# Patient Record
Sex: Female | Born: 1980 | Race: White | Hispanic: No | Marital: Married | Smoking: Never smoker
Health system: Southern US, Community
[De-identification: ages and names within clinical notes are randomized; demographics above are authoritative.]

## PROBLEM LIST (undated history)

## (undated) ENCOUNTER — Ambulatory Visit (HOSPITAL_BASED_OUTPATIENT_CLINIC_OR_DEPARTMENT_OTHER)

## (undated) DIAGNOSIS — E079 Disorder of thyroid, unspecified: Secondary | ICD-10-CM

## (undated) DIAGNOSIS — J019 Acute sinusitis, unspecified: Secondary | ICD-10-CM

## (undated) DIAGNOSIS — G4733 Obstructive sleep apnea (adult) (pediatric): Secondary | ICD-10-CM

## (undated) DIAGNOSIS — F419 Anxiety disorder, unspecified: Secondary | ICD-10-CM

## (undated) DIAGNOSIS — Z9889 Other specified postprocedural states: Secondary | ICD-10-CM

## (undated) DIAGNOSIS — E039 Hypothyroidism, unspecified: Secondary | ICD-10-CM

## (undated) DIAGNOSIS — Z9989 Dependence on other enabling machines and devices: Secondary | ICD-10-CM

## (undated) DIAGNOSIS — R112 Nausea with vomiting, unspecified: Secondary | ICD-10-CM

## (undated) HISTORY — PX: OTHER SURGICAL HISTORY: SHX169

## (undated) HISTORY — PX: BREAST SURGERY: SHX581

## (undated) HISTORY — DX: Anxiety disorder, unspecified: F41.9

## (undated) HISTORY — DX: Disorder of thyroid, unspecified: E07.9

## (undated) HISTORY — DX: Obstructive sleep apnea (adult) (pediatric): Z99.89

## (undated) HISTORY — DX: Obstructive sleep apnea (adult) (pediatric): G47.33

---

## 2007-01-05 ENCOUNTER — Other Ambulatory Visit: Admission: RE | Admit: 2007-01-05 | Discharge: 2007-01-05 | Payer: Self-pay | Admitting: Family Medicine

## 2008-11-26 ENCOUNTER — Inpatient Hospital Stay (HOSPITAL_COMMUNITY): Admission: AD | Admit: 2008-11-26 | Discharge: 2008-11-28 | Payer: Self-pay | Admitting: Obstetrics & Gynecology

## 2008-11-26 ENCOUNTER — Encounter (INDEPENDENT_AMBULATORY_CARE_PROVIDER_SITE_OTHER): Payer: Self-pay | Admitting: Obstetrics and Gynecology

## 2010-09-21 LAB — CBC
HCT: 29.9 % — ABNORMAL LOW (ref 36.0–46.0)
HCT: 37.7 % (ref 36.0–46.0)
Hemoglobin: 10.9 g/dL — ABNORMAL LOW (ref 12.0–15.0)
MCHC: 36.3 g/dL — ABNORMAL HIGH (ref 30.0–36.0)
MCHC: 36 g/dL (ref 30.0–36.0)
MCV: 91.4 fL (ref 78.0–100.0)
MCV: 90.8 fL (ref 78.0–100.0)
Platelets: 166 10*3/uL (ref 150–400)
Platelets: 174 10*3/uL (ref 150–400)
RBC: 3.27 MIL/uL — ABNORMAL LOW (ref 3.87–5.11)
RBC: 4.23 MIL/uL (ref 3.87–5.11)
RDW: 14.2 % (ref 11.5–15.5)
RDW: 14.5 % (ref 11.5–15.5)

## 2010-09-21 LAB — COMPREHENSIVE METABOLIC PANEL
Albumin: 2.9 g/dL — ABNORMAL LOW (ref 3.5–5.2)
Alkaline Phosphatase: 125 U/L — ABNORMAL HIGH (ref 39–117)
BUN: 6 mg/dL (ref 6–23)
Potassium: 3.7 mEq/L (ref 3.5–5.1)
Total Protein: 5.8 g/dL — ABNORMAL LOW (ref 6.0–8.3)

## 2010-09-21 LAB — URIC ACID: Uric Acid, Serum: 5.6 mg/dL (ref 2.4–7.0)

## 2010-09-21 LAB — RPR: RPR Ser Ql: NONREACTIVE

## 2010-10-27 NOTE — Op Note (Signed)
Kim Lopez, Kim Lopez                ACCOUNT NO.:  [id number]   MEDICAL RECORD NO.:  [id number]          PATIENT TYPE:  INP   LOCATION:  9102                          FACILITY:  [facility]   PHYSICIAN:  Kendra H. Tenny Craw, MD     DATE OF BIRTH:  [birth year]   DATE OF PROCEDURE:  11/26/2008  DATE OF DISCHARGE:                               OPERATIVE REPORT   PREOPERATIVE DIAGNOSES:  1. A 39-week intrauterine pregnancy.  2. Failure to progress.  3. Suspected fetal macrosomia.  4. Maternal obesity.   POSTOPERATIVE DIAGNOSES:  1. A 39-week intrauterine pregnancy.  2. Failure to progress.  3. Maternal obesity.  4. Brow presentation.   PROCEDURE:  Primary low transverse cesarean section via Pfannenstiel  skin incision.   SURGEON:  Freddrick March. Tenny Craw, MD   ASSISTANT:  None.   ANESTHESIA:  Epidural.   OPERATIVE FINDINGS:  Vigorous female infant in the vertex brow  presentation, weighing 6 pounds 10 ounces with Apgar scores of 8 at 1  minute and 9 at 5 minutes.  There was an arcuate appearance to the  patient's uterus.  Ovaries and tubes were normal in appearance as was  the appendix which was visualized during the procedure.   SPECIMENS:  Placenta to Pathology.   ESTIMATED BLOOD LOSS:  700 mL.   COMPLICATIONS:  None.   PROCEDURE:  Kim Lopez is a 30 year old, G1, P0 at 39 weeks and 2 days,  who was admitted for spontaneous rupture of membranes at 5 o'clock on  the morning of November 26, 2008.  At that time, she was 1-2 cm dilated at  the noon.  She was 4-5 cm dilated, 80% effaced, -1 station.  A small  amount of caput and molding were noted.  An estimated fetal weight had  been obtained by ultrasound on 1 week prior to admission which  demonstrated an estimated fetal weight of 3900 g.  Given the estimated  fetal weight and the development of caput and molding, there was concern  for cephalopelvic disproportion.  An intrauterine pressure catheter was  placed at this time as she desired a trial  of labor.  Pitocin was  required for augmentation.  Actually, the patient received her epidural.  However at 5:30, the patient had made no further cervical change and the  caput and molding was more pronounced as were more severe variables with  contractions.  Given the concern for failure of cephalopelvic  disproportion and failure to progress, the decision was made to proceed  with primary low transverse cesarean section.  Risks, benefits, and  alternatives of the procedure were discussed with the patient and her  husband.  Following the appropriate informed consent, the patient was  brought to the operating room where epidural anesthesia was confirmed to  be adequate.  She was placed in the dorsal supine position with a  leftward tilt, prepped and draped in normal sterile fashion.  A scalpel  was used to make a Pfannenstiel skin incision which was carried down  through the underlying layers of soft tissue to the fascia.  Fascia was  incised in the midline.  Fascial incision was extended laterally with  Mayo scissors.  Superior aspect of the fascial incision was grasped with  Kocher clamps, tented up.  The underlying rectus muscles were dissected  off sharply with the electrocautery unit.  The same procedure was  repeated on the inferior aspect of the fascial incision.  The abdominal  peritoneum was then identified, tented up, entered sharply with the  Metzenbaum.  The incision was extended superiorly and inferiorly with  good visualization of the bladder.  At this time, the appendix was  visualized and the incision was found to be normal.  The Alexis  retractor was then placed in the abdominal cavity and erected.  The  small bowel was packed away with moist laparotomy sponge.  The  vesicouterine peritoneum was then tented up, entered sharply with  Metzenbaum, and the incision was extended laterally, and the bladder  flap was created with blunt dissection.  The bandage scissors were  then  used to extend the left aspect of the incision.  The fetal vertex was  identified and delivered through the uterine incision followed by the  body.  The infant cried vigorously on the operative field.  At the time  of delivery, the caput and molding was noted on the anterior portion of  the infant's head consistent with a brow presentation.  Infant was  passed to awaiting pediatricians.  Placenta was then spontaneously  delivered.  The uterus was exteriorized and cleared of all clot and  debris.  The uterus was noted to have an arcuate appearance and palpably  the internal portion of the uterus felt arcuate.  The uterine incision  was repaired with #1 chromic in a running locked fashion followed by a  second imbricating layer.  Ovaries and tubes were found to be normal.  The uterus was returned to the abdominal cavity.  The abdominal cavity  was cleared of all clot and debris.  Uterine incision was found to be  hemostatic.  The Alexis retractor was then disengaged.  The abdominal  peritoneum was reapproximated with 2-0 Vicryl in a running fashion.  The  rectus muscles were reapproximated with #1 chromic in a running fashion.  The fascia was reapproximated with a looped PDS.  The subcutaneous  tissue was reapproximated with 2-0 plain gut suture and the skin was  closed with staples.  All sponge, lap, needle counts were correct x2.  The patient tolerated the procedure well and was brought to the recovery  room in stable condition.      Freddrick March. Tenny Craw, MD  Electronically Signed     KHR/MEDQ  D:  11/26/2008  T:  11/27/2008  Job:  [id number]

## 2010-10-30 NOTE — Discharge Summary (Signed)
Kim Lopez, Kim Lopez NO.:  [id number]   MEDICAL RECORD NO.:  [id number]          PATIENT TYPE:  INP   LOCATION:  9102                          FACILITY:  [facility]   PHYSICIAN:  Carrington Clamp, M.D. DATE OF BIRTH:  20-Dec-1980   DATE OF ADMISSION:  11/26/2008  DATE OF DISCHARGE:  11/28/2008                               DISCHARGE SUMMARY   FINAL DIAGNOSES:  Intrauterine pregnancy at 57 weeks' gestation, failure  to progress, suspected fetal macrosomia, maternal obesity, and brow  presentation.   PROCEDURE:  Primary low-transverse cesarean section.   SURGEON:  Freddrick March. Tenny Craw, MD   COMPLICATIONS:  None.   This 30 year old, G1, P0 was admitted with spontaneous rupture of  membranes about 39-2/[redacted] weeks gestation.  The patient's antepartum course  up to this point had been uncomplicated.  The patient does have a  history of depression anxiety, but had not been on medications.  During  this gestation, besides obesity, she has no other major medical  problems.   HOSPITAL COURSE:  She was admitted.  She dilated about 4-5 cm dilation,  80% effaced at -1 station.  At this point, a small amount of clubfoot  molding were noted.  The patient had had an ultrasound a week earlier  showing estimated fetal weight of about 1300 g.  There was some concern  for cephalo-pelvic disproportion.  IUPCs were placed and Pitocin was  required for augmentation.  The patient did receive an epidural for pain  control of 530.  The patient had not made any further change in her  cervix.  The molding was more pronounced.  At this point, a discussion  was held with the patient, decision was made to proceed with cesarean  section secondary to failure to progress.  The patient was taken to the  operating room on November 26, 2008 by Dr. Waynard Reeds, where a primary low  transverse cesarean section was performed with delivery of a 6 pound 10  ounce female infant with Apgars of 8 and 9.  Delivery went  without  complications.  There was an articulate appearance to the patient's  uterus, but no complications.  The patient's postoperative course was  benign without any significant fevers.  The patient was felt ready for  discharge on postoperative day #2.  She was sent home on a regular diet,  told to decrease activities, told to continue her prenatal vitamins, was  given a prescription for Percocet 1 to 2 every 4-6 hours as needed for  pain.  The patient did not need RhoGAM.  She is Rh negative, but the  baby was Rh negative as well.  She was to follow up in the office the  next day for her staple removal.  Instructions and precautions were  reviewed with the patient.   LABORATORY ON DISCHARGE:  The patient had a hemoglobin of 10.9, white  blood cell count of 15.0, and platelets of 129,000.      Leilani Able, P.A.-C.      Carrington Clamp, M.D.  Electronically Signed    MB/MEDQ  D:  12/09/2008  T:  12/10/2008  Job:  [id number]

## 2012-11-25 ENCOUNTER — Ambulatory Visit (INDEPENDENT_AMBULATORY_CARE_PROVIDER_SITE_OTHER): Payer: Federal, State, Local not specified - PPO | Admitting: Family Medicine

## 2012-11-25 DIAGNOSIS — H6690 Otitis media, unspecified, unspecified ear: Secondary | ICD-10-CM

## 2012-11-25 DIAGNOSIS — H9203 Otalgia, bilateral: Secondary | ICD-10-CM

## 2012-11-25 MED ORDER — HYDROCORTISONE-ACETIC ACID 1-2 % OT SOLN: 5.0000 [drp] | Freq: Two times a day (BID) | OTIC | Status: DC

## 2012-11-25 MED ORDER — AMOXICILLIN 875 MG PO TABS: 875.0000 mg | ORAL_TABLET | Freq: Two times a day (BID) | ORAL | Status: DC

## 2012-11-25 NOTE — Progress Notes (Signed)
[facility]:  Office Visit  Chief Complaint:   Chief Complaint  Patient presents with  . Otalgia  . Ear Drainage    HPI: Kim Lopez is a 32 y.o. female who complains of  Bilateral ear pain x 2-3 weeks, pain started yesterday, sharp in right and milky discharge. She was being seen by ENT and they had givenher drops but she stopped using the drops since they recently moved and the drops are in a box somewhere. She is new to this area. She denies fevers, chills, n/v, hearing loss. No ringing in ear. Just pain and Dc  Past Medical History  Diagnosis Date  . Anxiety   . Thyroid disease    Past Surgical History  Procedure Laterality Date  . Breast surgery    . Cesarean section     History   Social History  . Marital Status: Married    Spouse Name: N/A    Number of Children: N/A  . Years of Education: N/A   Social History Main Topics  . Smoking status: Never Smoker   . Smokeless tobacco: None  . Alcohol Use: None  . Drug Use: None  . Sexually Active: None   Other Topics Concern  . None   Social History Narrative  . None   Family History  Problem Relation Age of Onset  . Heart attack Mother    No Known Allergies Prior to Admission medications   Medication Sig Start Date End Date Taking? Authorizing Provider  levothyroxine (SYNTHROID, LEVOTHROID) 100 MCG tablet Take 100 mcg by mouth daily before breakfast.   Yes Historical Provider, MD     ROS: The patient denies fevers, chills, night sweats, unintentional weight loss, chest pain, palpitations, wheezing, dyspnea on exertion, nausea, vomiting, abdominal pain, dysuria, hematuria, melena, numbness, weakness, or tingling.  All other systems have been reviewed and were otherwise negative with the exception of those mentioned in the HPI and as above.    PHYSICAL EXAM: Filed Vitals:   11/25/12 1322  BP: 117/76  Pulse: 60  Temp: 97.9 F (36.6 C)  Resp: 16   Filed Vitals:   11/25/12 1322   Height: 5\' 1"  (1.549 m)  Weight: 222 lb (100.699 kg)   Body mass index is 41.97 kg/(m^2).  General: Alert, no acute distress HEENT:  Normocephalic, atraumatic, oropharynx patent. Bilateral white dc but does not look like fungal in origin. + Tm erythema bil Cardiovascular:  Regular rate and rhythm, no rubs murmurs or gallops.  No Carotid bruits, radial pulse intact. No pedal edema.  Respiratory: Clear to auscultation bilaterally.  No wheezes, rales, or rhonchi.  No cyanosis, no use of accessory musculature GI: No organomegaly, abdomen is soft and non-tender, positive bowel sounds.  No masses. Skin: No rashes. Neurologic: Facial musculature symmetric. Psychiatric: Patient is appropriate throughout our interaction. Lymphatic: No cervical lymphadenopathy Musculoskeletal: Gait intact.   LABS: Results for orders placed during the hospital encounter of 11/26/08  CBC      Result Value Range   WBC 15.0 (*) 4.0 - 10.5 K/uL   RBC 3.27 (*) 3.87 - 5.11 MIL/uL   Hemoglobin 10.9 DELTA CHECK NOTED (*) 12.0 - 15.0 g/dL   HCT 45.4 (*) 09.8 - 11.9 %   MCV 91.4  78.0 - 100.0 fL   MCHC 36.3 (*) 30.0 - 36.0 g/dL   RDW 14.7  82.9 - 56.2 %   Platelets 129 DELTA CHECK NOTED (*) 150 - 400 K/uL  CBC  Result Value Range   WBC 10.9 (*) 4.0 - 10.5 K/uL   RBC 4.23  3.87 - 5.11 MIL/uL   Hemoglobin 13.8  12.0 - 15.0 g/dL   HCT 16.1  09.6 - 04.5 %   MCV 90.8  78.0 - 100.0 fL   MCHC 36.0  30.0 - 36.0 g/dL   RDW 40.9  81.1 - 91.4 %   Platelets 166  150 - 400 K/uL  RPR      Result Value Range   RPR NON REACTIVE  NON REACTIVE  CBC      Result Value Range   WBC 12.9 (*) 4.0 - 10.5 K/uL   RBC 4.13  3.87 - 5.11 MIL/uL   Hemoglobin 13.4  12.0 - 15.0 g/dL   HCT 78.2  95.6 - 21.3 %   MCV 91.3  78.0 - 100.0 fL   MCHC 35.7 CORRECTED FOR COLD AGGLUTININS  30.0 - 36.0 g/dL   RDW 08.6  57.8 - 46.9 %   Platelets 174  150 - 400 K/uL  COMPREHENSIVE METABOLIC PANEL      Result Value Range   Sodium 134 (*) 135  - 145 mEq/L   Potassium 3.7  3.5 - 5.1 mEq/L   Chloride 104  96 - 112 mEq/L   CO2 22  19 - 32 mEq/L   Glucose, Bld 84  70 - 99 mg/dL   BUN 6  6 - 23 mg/dL   Creatinine, Ser 6.29  0.4 - 1.2 mg/dL   Calcium 9.6  8.4 - 52.8 mg/dL   Total Protein 5.8 (*) 6.0 - 8.3 g/dL   Albumin 2.9 (*) 3.5 - 5.2 g/dL   AST 20  0 - 37 U/L   ALT 16  0 - 35 U/L   Alkaline Phosphatase 125 (*) 39 - 117 U/L   Total Bilirubin 0.7  0.3 - 1.2 mg/dL   GFR calc non Af Amer >60  >60 mL/min   GFR calc Af Amer    >60 mL/min   Value: >60            The eGFR has been calculated     using the MDRD equation.     This calculation has not been     validated in all clinical     situations.     eGFR's persistently     <60 mL/min signify     possible Chronic Kidney Disease.  LACTATE DEHYDROGENASE      Result Value Range   LDH 137  94 - 250 U/L  URIC ACID      Result Value Range   Uric Acid, Serum 5.6  2.4 - 7.0 mg/dL     EKG/XRAY:   Primary read interpreted by Dr. Conley Rolls at [facility].   ASSESSMENT/PLAN: Encounter Diagnoses  Name Primary?  . Otitis media, unspecified laterality Yes  . Otalgia, bilateral    Vosol HCT Amox 875 mg BID Call me with ear medication once she finds the drops.  Refer to ENT F/u prn   Carrye Goller PHUONG, DO 11/27/2012 9:29 AM

## 2012-12-11 DIAGNOSIS — H60393 Other infective otitis externa, bilateral: Secondary | ICD-10-CM

## 2012-12-11 DIAGNOSIS — M26629 Arthralgia of temporomandibular joint, unspecified side: Secondary | ICD-10-CM

## 2012-12-11 DIAGNOSIS — H60399 Other infective otitis externa, unspecified ear: Secondary | ICD-10-CM | POA: Insufficient documentation

## 2013-08-24 ENCOUNTER — Encounter (INDEPENDENT_AMBULATORY_CARE_PROVIDER_SITE_OTHER): Payer: Self-pay | Admitting: General Surgery

## 2013-08-24 ENCOUNTER — Ambulatory Visit (INDEPENDENT_AMBULATORY_CARE_PROVIDER_SITE_OTHER): Payer: Federal, State, Local not specified - PPO | Admitting: General Surgery

## 2013-08-24 ENCOUNTER — Encounter (INDEPENDENT_AMBULATORY_CARE_PROVIDER_SITE_OTHER): Payer: Self-pay

## 2013-08-24 DIAGNOSIS — G4733 Obstructive sleep apnea (adult) (pediatric): Secondary | ICD-10-CM

## 2013-08-24 DIAGNOSIS — E785 Hyperlipidemia, unspecified: Secondary | ICD-10-CM

## 2013-08-24 LAB — CBC WITH DIFFERENTIAL/PLATELET
BASOS ABS: 0 10*3/uL (ref 0.0–0.1)
BASOS PCT: 0 % (ref 0–1)
EOS ABS: 0.1 10*3/uL (ref 0.0–0.7)
EOS PCT: 2 % (ref 0–5)
HCT: 41.2 % (ref 36.0–46.0)
Hemoglobin: 14.6 g/dL (ref 12.0–15.0)
LYMPHS ABS: 2.4 10*3/uL (ref 0.7–4.0)
Lymphocytes Relative: 40 % (ref 12–46)
MCH: 30 pg (ref 26.0–34.0)
MCHC: 35.4 g/dL (ref 30.0–36.0)
MCV: 84.8 fL (ref 78.0–100.0)
Monocytes Absolute: 0.4 10*3/uL (ref 0.1–1.0)
Monocytes Relative: 7 % (ref 3–12)
NEUTROS PCT: 51 % (ref 43–77)
Neutro Abs: 3.1 10*3/uL (ref 1.7–7.7)
PLATELETS: 229 10*3/uL (ref 150–400)
RBC: 4.86 MIL/uL (ref 3.87–5.11)
RDW: 13.7 % (ref 11.5–15.5)
WBC: 6 10*3/uL (ref 4.0–10.5)

## 2013-08-24 LAB — LIPID PANEL
CHOLESTEROL: 219 mg/dL — AB (ref 0–200)
HDL: 42 mg/dL (ref >39)
LDL CALC: 121 mg/dL — AB (ref 0–99)
TRIGLYCERIDES: 279 mg/dL — AB (ref <150)
Total CHOL/HDL Ratio: 5.2 Ratio
VLDL: 56 mg/dL — AB (ref 0–40)

## 2013-08-24 LAB — COMPREHENSIVE METABOLIC PANEL
ALK PHOS: 64 U/L (ref 39–117)
ALT: 33 U/L (ref 0–35)
AST: 21 U/L (ref 0–37)
Albumin: 4.5 g/dL (ref 3.5–5.2)
BILIRUBIN TOTAL: 0.5 mg/dL (ref 0.2–1.2)
BUN: 8 mg/dL (ref 6–23)
CO2: 29 meq/L (ref 19–32)
CREATININE: 0.73 mg/dL (ref 0.50–1.10)
Calcium: 9.6 mg/dL (ref 8.4–10.5)
Chloride: 101 mEq/L (ref 96–112)
Glucose, Bld: 90 mg/dL (ref 70–99)
Potassium: 4.1 mEq/L (ref 3.5–5.3)
SODIUM: 137 meq/L (ref 135–145)
TOTAL PROTEIN: 6.9 g/dL (ref 6.0–8.3)

## 2013-08-24 NOTE — Progress Notes (Signed)
Subjective:   morbid obesity  Patient ID: Kim SinningKelly Kucher, female   DOB: [birth year], 33 y.o.   MRN: [id number]  HPI Ronny BaconKelly Garmon33 y.o.female presents for consideration for surgical treatment for morbid obesity.  She  gives a history of progressive obesity since early 4320s despite multiple attempts at medical management. Her weight increased significantly after the birth of her child 4 years ago. She has been through enumerable efforts at nonsurgical weight loss and is able to lose about 20 pounds but then experiences progressive weight regain plus additional weight.  her weight has been affecting her in a number of ways including a diagnosis of sleep apnea requiring CPAP, some dyspnea on exertion and fatigue and joint pain and difficulty enjoying activities with her family and routine activities and exercise. She is concerned about her long-term health going forward..   She has been to our initial information seminar, researched surgical options thoroughly and is interested in lap band surgery due to the less invasive nature of the surgery.  Past Medical History  Diagnosis Date  . Anxiety   . Thyroid disease   . OSA on CPAP    Past Surgical History  Procedure Laterality Date  . Breast surgery    . Cesarean section     Current Outpatient Prescriptions  Medication Sig Dispense Refill  . levothyroxine (SYNTHROID, LEVOTHROID) 100 MCG tablet Take 100 mcg by mouth daily before breakfast.       No current facility-administered medications for this visit.   No Known Allergies History  Substance Use Topics  . Smoking status: Never Smoker   . Smokeless tobacco: Not on file  . Alcohol Use: Not on file        Review of Systems Respiratory: Some dyspnea with exercise Cardiac: Negative Abdomen: No reflux, abdominal pain or history of jaundice GU: Negative Extremities: Some joint pain Hematologic: No history of blood clots or abnormal bleeding    Objective:   Physical Exam BP 126/86  Pulse  80  Temp(Src) 98.4 F (36.9 C) (Oral)  Resp 14  Ht 5\' 2"  (1.575 m)  Wt 233 lb (105.688 kg)  BMI 42.61 kg/m2  General: Alert, OB Caucasian female, in no distress Skin: Warm and dry without rash or infection. HEENT: No palpable masses or thyromegaly. Sclera nonicteric. Pupils equal round and reactive. Oropharynx clear. Lymph nodes: No cervical, supraclavicular, or inguinal nodes palpable. Lungs: Breath sounds clear and equal without increased work of breathing Cardiovascular: Regular rate and rhythm without murmur. No JVD or edema. Peripheral pulses intact. Abdomen: Nondistended. Soft and nontender. No masses palpable. No organomegaly. No palpable hernias. Extremities: No edema or joint swelling or deformity. No chronic venous stasis changes. Neurologic: Alert and fully oriented. Gait normal.    Assessment:     Patient with progressive morbid obesity unresponsive to multiple efforts at medical management who presents with a BMI of 42.6 and comorbidities of obstructive sleep apnea, dyslipidemia and joint pain. I believe there would be very significant medical benefit from surgical weight loss. After our discussion of surgical options currently available the patient has decided to proceed with LAP-BAND placement due to the reasons above. We have discussed the nature of morbid obesity and the risk of remaining obese. We discussed the indications for the procedure, its nature, and expected recovery. The risks of the procedure were discussed in detail including anesthetic complications, bleeding, infection, visceral injury, dysphagia, and long-term risks of mechanical failure, slippage, erosion, esophageal dilatation and failure to lose weight. Rare risk of death  was discussed. The patient was given a complete consent form and all questions were answered. We will proceed with preoperative workup including routine lab and x-rays, psychologic and nutrition evaluations, and upper GI series.  I will see the  patient back following this evaluation.        Plan:     As above

## 2013-08-25 LAB — TSH: TSH: 3.039 u[IU]/mL (ref 0.350–4.500)

## 2013-08-25 LAB — HCG, SERUM, QUALITATIVE: Preg, Serum: NEGATIVE

## 2013-08-25 LAB — T3 UPTAKE: T3 Uptake: 31.6 % (ref 22.5–37.0)

## 2013-08-28 ENCOUNTER — Ambulatory Visit (HOSPITAL_COMMUNITY)
Admission: RE | Admit: 2013-08-28 | Discharge: 2013-08-28 | Disposition: A | Discharge status: Home / Self Care | Payer: Federal, State, Local not specified - PPO | Source: Ambulatory Visit | Attending: General Surgery | Admitting: General Surgery

## 2013-08-28 ENCOUNTER — Other Ambulatory Visit: Payer: Self-pay

## 2013-08-28 DIAGNOSIS — F411 Generalized anxiety disorder: Secondary | ICD-10-CM | POA: Insufficient documentation

## 2013-08-28 DIAGNOSIS — E079 Disorder of thyroid, unspecified: Secondary | ICD-10-CM | POA: Insufficient documentation

## 2013-08-28 DIAGNOSIS — E785 Hyperlipidemia, unspecified: Secondary | ICD-10-CM

## 2013-08-28 DIAGNOSIS — G4733 Obstructive sleep apnea (adult) (pediatric): Secondary | ICD-10-CM | POA: Insufficient documentation

## 2013-08-28 DIAGNOSIS — Z6841 Body Mass Index (BMI) 40.0 and over, adult: Secondary | ICD-10-CM | POA: Insufficient documentation

## 2013-10-10 ENCOUNTER — Encounter: Payer: Federal, State, Local not specified - PPO | Attending: General Surgery | Admitting: Dietician

## 2013-10-10 ENCOUNTER — Encounter: Payer: Self-pay | Admitting: Dietician

## 2013-10-10 DIAGNOSIS — Z6841 Body Mass Index (BMI) 40.0 and over, adult: Secondary | ICD-10-CM | POA: Insufficient documentation

## 2013-10-10 DIAGNOSIS — Z01818 Encounter for other preprocedural examination: Secondary | ICD-10-CM | POA: Insufficient documentation

## 2013-10-10 DIAGNOSIS — Z713 Dietary counseling and surveillance: Secondary | ICD-10-CM | POA: Insufficient documentation

## 2013-10-10 NOTE — Progress Notes (Signed)
  Pre-Op Assessment Visit:  Pre-Operative LAGB Surgery  Medical Nutrition Therapy:  Appt start time: 0800   End time:  0845.  Patient was seen on 10/10/2013 for Pre-Operative LAGB Nutrition Assessment. [facility] on 10/10/2013.   Preferred Learning Style:   No preference indicated   Learning Readiness:  Ready  Handouts given during visit include:  Pre-Op Goals Bariatric Surgery Protein Shakes  Teaching Method Utilized:  Visual Auditory  Barriers to learning/adherence to lifestyle change: none  Demonstrated degree of understanding via:  Teach Back   Patient to call the [facility] to enroll in Pre-Op and Post-Op Nutrition Education when surgery date is scheduled.

## 2014-04-23 ENCOUNTER — Other Ambulatory Visit (INDEPENDENT_AMBULATORY_CARE_PROVIDER_SITE_OTHER): Payer: Self-pay | Admitting: General Surgery

## 2014-05-06 ENCOUNTER — Ambulatory Visit: Payer: Federal, State, Local not specified - PPO

## 2014-05-13 ENCOUNTER — Encounter: Payer: Federal, State, Local not specified - PPO | Attending: General Surgery

## 2014-05-13 DIAGNOSIS — Z6841 Body Mass Index (BMI) 40.0 and over, adult: Secondary | ICD-10-CM | POA: Insufficient documentation

## 2014-05-13 DIAGNOSIS — Z713 Dietary counseling and surveillance: Secondary | ICD-10-CM | POA: Insufficient documentation

## 2014-05-13 NOTE — Patient Instructions (Signed)
Follow:   Pre-Op Diet per MD 2 weeks prior to surgery  Phase 2- Liquids (clear/full) 2 weeks after surgery  Vitamin/Mineral/Calcium guidelines for purchasing bariatric supplements  Exercise guidelines pre and post-op per MD  Follow-up at [facility] in 2 weeks post-op for diet advancement. Contact Leslie Williams or Liz Schonthal as needed with questions/concerns. 

## 2014-05-13 NOTE — Progress Notes (Signed)
  Pre-Operative Nutrition Class:  Appt start time: 6333   End time:  1830.  Patient was seen on 05/13/14 for Pre-Operative Bariatric Surgery Education at the [facility].   Surgery date: 05/28/14 Surgery type: LAGB Start weight at [facility]: 233 lbs on 10/10/13 Weight today: 234.0 lbs  TANITA  BODY COMP RESULTS  05/13/14   BMI (kg/m^2) 42.8   Fat Mass (lbs) 117.0   Fat Free Mass (lbs) 117.0   Total Body Water (lbs) 85.5   Samples given per MNT protocol. Patient educated on appropriate usage: Celebrate Multivitamin Pineapple-Strawberry Lot # [id number] Exp: 01/16  Bariatric Advantage Calcium Citrate with Vitamin D Chocolate Lot # [id number] Exp: 6/16  Bariatric Advantage Vitamin B12 [place] Lot # [id number] Exp: 12/15  Unjury Protein Powder Vanilla Lot # [id number] Exp: 03/2015  Premier Protein Strawberry Lot # [id number] Exp: 11/10/14  PB2 Chocolate Exp: 04/26/15 No lot # given  The following the learning objectives were met by the patient during this course:  Identify Pre-Op Dietary Goals and will begin 2 weeks pre-operatively  Identify appropriate sources of fluids and proteins   State protein recommendations and appropriate sources pre and post-operatively  Identify Post-Operative Dietary Goals and will follow for 2 weeks post-operatively  Identify appropriate multivitamin and calcium sources  Describe the need for physical activity post-operatively and will follow MD recommendations  State when to call healthcare provider regarding medication questions or post-operative complications  Handouts given during class include:  Pre-Op Bariatric Surgery Diet Handout  Protein Shake Handout  Post-Op Bariatric Surgery Nutrition Handout  BELT Program Information Flyer  Support Group Information Flyer  [facility] Bariatric Supplements Price List  Follow-Up Plan: Patient will follow-up at [facility] 2 weeks post operatively for diet  advancement per MD.

## 2014-05-24 ENCOUNTER — Encounter (HOSPITAL_COMMUNITY): Payer: Self-pay

## 2014-05-24 ENCOUNTER — Encounter (HOSPITAL_COMMUNITY)
Admission: RE | Admit: 2014-05-24 | Discharge: 2014-05-24 | Disposition: A | Discharge status: Home / Self Care | Payer: Federal, State, Local not specified - PPO | Source: Ambulatory Visit | Attending: General Surgery | Admitting: General Surgery

## 2014-05-24 DIAGNOSIS — R05 Cough: Secondary | ICD-10-CM | POA: Insufficient documentation

## 2014-05-24 DIAGNOSIS — R490 Dysphonia: Secondary | ICD-10-CM | POA: Insufficient documentation

## 2014-05-24 DIAGNOSIS — R0981 Nasal congestion: Secondary | ICD-10-CM | POA: Diagnosis not present

## 2014-05-24 DIAGNOSIS — Z01812 Encounter for preprocedural laboratory examination: Secondary | ICD-10-CM | POA: Diagnosis present

## 2014-05-24 DIAGNOSIS — J329 Chronic sinusitis, unspecified: Secondary | ICD-10-CM | POA: Insufficient documentation

## 2014-05-24 HISTORY — DX: Other specified postprocedural states: R11.2

## 2014-05-24 HISTORY — DX: Acute sinusitis, unspecified: J01.90

## 2014-05-24 HISTORY — DX: Hypothyroidism, unspecified: E03.9

## 2014-05-24 HISTORY — DX: Other specified postprocedural states: Z98.890

## 2014-05-24 LAB — COMPREHENSIVE METABOLIC PANEL
ALK PHOS: 69 U/L (ref 39–117)
ALT: 40 U/L — AB (ref 0–35)
AST: 47 U/L — AB (ref 0–37)
Albumin: 4.2 g/dL (ref 3.5–5.2)
Anion gap: 15 (ref 5–15)
BUN: 12 mg/dL (ref 6–23)
CALCIUM: 9.8 mg/dL (ref 8.4–10.5)
CO2: 25 mEq/L (ref 19–32)
Chloride: 99 mEq/L (ref 96–112)
Creatinine, Ser: 0.64 mg/dL (ref 0.50–1.10)
GFR calc Af Amer: >90 mL/min (ref >90)
GFR calc non Af Amer: >90 mL/min (ref >90)
Glucose, Bld: 96 mg/dL (ref 70–99)
POTASSIUM: 4.8 meq/L (ref 3.7–5.3)
Sodium: 139 mEq/L (ref 137–147)
TOTAL PROTEIN: 7.8 g/dL (ref 6.0–8.3)
Total Bilirubin: 0.5 mg/dL (ref 0.3–1.2)

## 2014-05-24 LAB — CBC WITH DIFFERENTIAL/PLATELET
BASOS PCT: 1 % (ref 0–1)
Basophils Absolute: 0.1 10*3/uL (ref 0.0–0.1)
EOS ABS: 0.2 10*3/uL (ref 0.0–0.7)
EOS PCT: 3 % (ref 0–5)
HCT: 42 % (ref 36.0–46.0)
HEMOGLOBIN: 14.8 g/dL (ref 12.0–15.0)
Lymphocytes Relative: 37 % (ref 12–46)
Lymphs Abs: 2.2 10*3/uL (ref 0.7–4.0)
MCH: 29.1 pg (ref 26.0–34.0)
MCHC: 35.2 g/dL (ref 30.0–36.0)
MCV: 82.5 fL (ref 78.0–100.0)
MONO ABS: 0.5 10*3/uL (ref 0.1–1.0)
Monocytes Relative: 9 % (ref 3–12)
NEUTROS PCT: 50 % (ref 43–77)
Neutro Abs: 2.9 10*3/uL (ref 1.7–7.7)
PLATELETS: 261 10*3/uL (ref 150–400)
RBC: 5.09 MIL/uL (ref 3.87–5.11)
RDW: 13.1 % (ref 11.5–15.5)
WBC: 5.9 10*3/uL (ref 4.0–10.5)

## 2014-05-24 NOTE — Patient Instructions (Addendum)
Joylene GrapesKelly R Madan  05/24/2014   BRING YOUR CPAP MASK AND TUBING   Your procedure is scheduled on:   Tuesday  December 15th  [facility]                Short Stay Center at  9:25 AM.  Call this number if you have problems the morning of surgery [phone]   Remember:  Do not eat food or drink liquids :After Midnight.     Take these medicines the morning of surgery with A SIP OF WATER:   SYNTHROID                               You may not have any metal on your body including hair pins and              piercings  Do not wear jewelry, make-up, lotions, powders or perfumes.             Do not wear nail polish.  Do not shave  48 hours prior to surgery.              Men may shave face and neck.   Do not bring valuables to the hospital. [facility] IS NOT             RESPONSIBLE   FOR VALUABLES.  Contacts, dentures or bridgework may not be worn into surgery.  Leave suitcase in the car. After surgery it may be brought to your room.     Patients discharged the day of surgery will not be allowed to drive home.  Name and phone number of your driver:  PT'S HUSBAND BRIAN  [phone]  Special Instructions: N/A              Please read over the following fact sheets you were given: _____________________________________________________________________             [facility] Pre[facility] Before surgery, you can play an important role.  Because skin is not sterile, your skin needs to be as free of germs as possible.  You can reduce the number of germs on your skin by washing with CHG (chlorahexidine gluconate) soap before surgery.  CHG is an antiseptic cleaner which kills germs and bonds with the skin to continue killing germs even after washing. Please DO NOT use if you have an allergy to CHG or antibacterial soaps.  If your skin becomes reddened/irritated stop using the CHG and inform your nurse when you arrive at Short Stay. Do not shave (including  legs and underarms) for at least 48 hours prior to the first CHG shower.  You may shave your face/neck. Please follow these instructions carefully:  1.  Shower with CHG Soap the night before surgery and the  morning of Surgery.  2.  If you choose to wash your hair, wash your hair first as usual with your  normal  shampoo.  3.  After you shampoo, rinse your hair and body thoroughly to remove the  shampoo.                           4.  Use CHG as you would any other liquid soap.  You can apply chg directly  to the skin and wash  Gently with a scrungie or clean washcloth.  5.  Apply the CHG Soap to your body ONLY FROM THE NECK DOWN.   Do not use on face/ open                           Wound or open sores. Avoid contact with eyes, ears mouth and genitals (private parts).                       Wash face,  Genitals (private parts) with your normal soap.             6.  Wash thoroughly, paying special attention to the area where your surgery  will be performed.  7.  Thoroughly rinse your body with warm water from the neck down.  8.  DO NOT shower/wash with your normal soap after using and rinsing off  the CHG Soap.                9.  Pat yourself dry with a clean towel.            10.  Wear clean pajamas.            11.  Place clean sheets on your bed the night of your first shower and do not  sleep with pets. Day of Surgery : Do not apply any lotions/deodorants the morning of surgery.  Please wear clean clothes to the hospital/surgery center.  FAILURE TO FOLLOW THESE INSTRUCTIONS MAY RESULT IN THE CANCELLATION OF YOUR SURGERY PATIENT SIGNATURE_________________________________  NURSE SIGNATURE__________________________________  ________________________________________________________________________

## 2014-05-24 NOTE — Pre-Procedure Instructions (Signed)
PT HAS EKG AND CXR REPORT IN EPIC FROM 08-28-13.

## 2014-05-24 NOTE — Progress Notes (Signed)
Dr. Johna SheriffHoxworth - Magnus SinningKelly Lopez [facility]op [facility] - her surgery is 12/15.  I just wanted to make you aware that she started an antibiotic for sinusitis on 12/9  Zithromycin.  She  Does not have fever, does have slight cough from the sinus drainage - but no chest congestion,  Does have head congestion and hoarseness.  Pt knows to notify your office if she gets worse and feels that she is unable to have her surgery.

## 2014-05-28 ENCOUNTER — Encounter (HOSPITAL_COMMUNITY)
Admission: RE | Disposition: A | Discharge status: Home / Self Care | Payer: Self-pay | Source: Ambulatory Visit | Attending: General Surgery

## 2014-05-28 ENCOUNTER — Observation Stay (HOSPITAL_COMMUNITY)
Admission: RE | Admit: 2014-05-28 | Discharge: 2014-05-29 | Disposition: A | Discharge status: Home / Self Care | Payer: Federal, State, Local not specified - PPO | Source: Ambulatory Visit | Attending: General Surgery | Admitting: General Surgery

## 2014-05-28 ENCOUNTER — Ambulatory Visit (HOSPITAL_COMMUNITY): Payer: Federal, State, Local not specified - PPO

## 2014-05-28 ENCOUNTER — Ambulatory Visit (HOSPITAL_COMMUNITY): Payer: Federal, State, Local not specified - PPO | Admitting: Anesthesiology

## 2014-05-28 ENCOUNTER — Encounter (HOSPITAL_COMMUNITY): Payer: Self-pay | Admitting: *Deleted

## 2014-05-28 DIAGNOSIS — G4730 Sleep apnea, unspecified: Secondary | ICD-10-CM | POA: Diagnosis not present

## 2014-05-28 DIAGNOSIS — Z9884 Bariatric surgery status: Secondary | ICD-10-CM

## 2014-05-28 DIAGNOSIS — F329 Major depressive disorder, single episode, unspecified: Secondary | ICD-10-CM | POA: Diagnosis not present

## 2014-05-28 DIAGNOSIS — E780 Pure hypercholesterolemia: Secondary | ICD-10-CM | POA: Diagnosis not present

## 2014-05-28 DIAGNOSIS — E039 Hypothyroidism, unspecified: Secondary | ICD-10-CM | POA: Insufficient documentation

## 2014-05-28 DIAGNOSIS — Z6841 Body Mass Index (BMI) 40.0 and over, adult: Secondary | ICD-10-CM | POA: Diagnosis not present

## 2014-05-28 DIAGNOSIS — K219 Gastro-esophageal reflux disease without esophagitis: Secondary | ICD-10-CM | POA: Insufficient documentation

## 2014-05-28 DIAGNOSIS — F419 Anxiety disorder, unspecified: Secondary | ICD-10-CM | POA: Diagnosis not present

## 2014-05-28 HISTORY — PX: LAPAROSCOPIC GASTRIC BANDING: SHX1100

## 2014-05-28 LAB — PREGNANCY, URINE: Preg Test, Ur: NEGATIVE

## 2014-05-28 SURGERY — GASTRIC BANDING, LAPAROSCOPIC
Anesthesia: General | Site: Abdomen

## 2014-05-28 MED ORDER — PROMETHAZINE HCL 25 MG/ML IJ SOLN
6.2500 mg | INTRAMUSCULAR | Status: DC | PRN
  Administered 2014-05-28: 6.25 mg via INTRAVENOUS
  Filled 2014-05-28: qty 1

## 2014-05-28 MED ORDER — CHLORHEXIDINE GLUCONATE CLOTH 2 % EX PADS: 6.0000 | MEDICATED_PAD | Freq: Once | CUTANEOUS | Status: DC

## 2014-05-28 MED ORDER — 0.9 % SODIUM CHLORIDE (POUR BTL) OPTIME
TOPICAL | Status: DC | PRN
  Administered 2014-05-28: 1000 mL

## 2014-05-28 MED ORDER — ACETAMINOPHEN 160 MG/5ML PO SOLN
325.0000 mg | ORAL | Status: DC | PRN
  Filled 2014-05-28: qty 20.3

## 2014-05-28 MED ORDER — ONDANSETRON HCL 4 MG/2ML IJ SOLN
INTRAMUSCULAR | Status: DC | PRN
Start: 2014-05-28 — End: 2014-05-28
  Administered 2014-05-28: 4 mg via INTRAVENOUS

## 2014-05-28 MED ORDER — NEOSTIGMINE METHYLSULFATE 10 MG/10ML IV SOLN
INTRAVENOUS | Status: DC | PRN
  Administered 2014-05-28: 4 mg via INTRAVENOUS

## 2014-05-28 MED ORDER — UNJURY CHICKEN SOUP POWDER
2.0000 [oz_av] | Freq: Four times a day (QID) | ORAL | Status: DC
  Administered 2014-05-29: 2 [oz_av] via ORAL
  Filled 2014-05-28 (×4): qty 27

## 2014-05-28 MED ORDER — HEPARIN SODIUM (PORCINE) 5000 UNIT/ML IJ SOLN
5000.0000 [IU] | Freq: Three times a day (TID) | INTRAMUSCULAR | Status: DC
  Administered 2014-05-28 – 2014-05-29 (×2): 5000 [IU] via SUBCUTANEOUS
  Filled 2014-05-28 (×6): qty 1

## 2014-05-28 MED ORDER — HYDROMORPHONE HCL 1 MG/ML IJ SOLN
INTRAMUSCULAR | Status: AC
  Filled 2014-05-28: qty 1

## 2014-05-28 MED ORDER — LACTATED RINGERS IV SOLN
INTRAVENOUS | Status: DC | PRN
  Administered 2014-05-28 (×2): via INTRAVENOUS

## 2014-05-28 MED ORDER — SCOPOLAMINE 1 MG/3DAYS TD PT72
MEDICATED_PATCH | TRANSDERMAL | Status: AC
  Filled 2014-05-28: qty 1

## 2014-05-28 MED ORDER — BUPIVACAINE-EPINEPHRINE 0.25% -1:200000 IJ SOLN
INTRAMUSCULAR | Status: DC | PRN
  Administered 2014-05-28: 50 mL

## 2014-05-28 MED ORDER — MEPERIDINE HCL 50 MG/ML IJ SOLN: 6.2500 mg | INTRAMUSCULAR | Status: DC | PRN

## 2014-05-28 MED ORDER — LIDOCAINE HCL (CARDIAC) 20 MG/ML IV SOLN
INTRAVENOUS | Status: DC | PRN
  Administered 2014-05-28: 100 mg via INTRAVENOUS

## 2014-05-28 MED ORDER — GLYCOPYRROLATE 0.2 MG/ML IJ SOLN
INTRAMUSCULAR | Status: AC
  Filled 2014-05-28: qty 3

## 2014-05-28 MED ORDER — CEFOXITIN SODIUM 2 G IV SOLR
INTRAVENOUS | Status: AC
  Filled 2014-05-28: qty 2

## 2014-05-28 MED ORDER — SODIUM CHLORIDE 0.9 % IJ SOLN
INTRAMUSCULAR | Status: AC
  Filled 2014-05-28: qty 30

## 2014-05-28 MED ORDER — HEPARIN SODIUM (PORCINE) 5000 UNIT/ML IJ SOLN
5000.0000 [IU] | INTRAMUSCULAR | Status: AC
  Administered 2014-05-28: 5000 [IU] via SUBCUTANEOUS
  Filled 2014-05-28: qty 1

## 2014-05-28 MED ORDER — NEOSTIGMINE METHYLSULFATE 10 MG/10ML IV SOLN
INTRAVENOUS | Status: AC
  Filled 2014-05-28: qty 1

## 2014-05-28 MED ORDER — SODIUM CHLORIDE 0.9 % IJ SOLN
INTRAMUSCULAR | Status: DC | PRN
  Administered 2014-05-28: 20 mL via INTRAVENOUS

## 2014-05-28 MED ORDER — OXYCODONE HCL 5 MG/5ML PO SOLN: 5.0000 mg | ORAL | Status: AC | PRN

## 2014-05-28 MED ORDER — PROPOFOL 10 MG/ML IV BOLUS
INTRAVENOUS | Status: DC | PRN
  Administered 2014-05-28: 150 mg via INTRAVENOUS

## 2014-05-28 MED ORDER — ACETAMINOPHEN 160 MG/5ML PO SOLN
650.0000 mg | ORAL | Status: DC | PRN
  Filled 2014-05-28: qty 20.3

## 2014-05-28 MED ORDER — ONDANSETRON HCL 4 MG/2ML IJ SOLN
INTRAMUSCULAR | Status: AC
  Filled 2014-05-28: qty 4

## 2014-05-28 MED ORDER — PROPOFOL 10 MG/ML IV BOLUS
INTRAVENOUS | Status: AC
  Filled 2014-05-28: qty 20

## 2014-05-28 MED ORDER — HYDROMORPHONE HCL 1 MG/ML IJ SOLN
0.2500 mg | INTRAMUSCULAR | Status: DC | PRN
  Administered 2014-05-28 (×3): 0.5 mg via INTRAVENOUS

## 2014-05-28 MED ORDER — FENTANYL CITRATE 0.05 MG/ML IJ SOLN
INTRAMUSCULAR | Status: DC | PRN
  Administered 2014-05-28 (×4): 50 ug via INTRAVENOUS

## 2014-05-28 MED ORDER — LIDOCAINE HCL (CARDIAC) 20 MG/ML IV SOLN
INTRAVENOUS | Status: AC
  Filled 2014-05-28: qty 5

## 2014-05-28 MED ORDER — OXYCODONE HCL 5 MG PO TABS: 5.0000 mg | ORAL_TABLET | Freq: Once | ORAL | Status: DC | PRN

## 2014-05-28 MED ORDER — DEXTROSE 5 % IV SOLN
2.0000 g | INTRAVENOUS | Status: AC
  Administered 2014-05-28: 2 g via INTRAVENOUS

## 2014-05-28 MED ORDER — OXYCODONE HCL 5 MG/5ML PO SOLN
5.0000 mg | Freq: Once | ORAL | Status: DC | PRN
  Filled 2014-05-28: qty 5

## 2014-05-28 MED ORDER — GLYCOPYRROLATE 0.2 MG/ML IJ SOLN
INTRAMUSCULAR | Status: DC | PRN
  Administered 2014-05-28: .6 mg via INTRAVENOUS

## 2014-05-28 MED ORDER — OXYCODONE HCL 5 MG/5ML PO SOLN
5.0000 mg | ORAL | Status: DC | PRN
  Administered 2014-05-28: 5 mg via ORAL
  Filled 2014-05-28: qty 5

## 2014-05-28 MED ORDER — MIDAZOLAM HCL 2 MG/2ML IJ SOLN
INTRAMUSCULAR | Status: AC
  Filled 2014-05-28: qty 2

## 2014-05-28 MED ORDER — FENTANYL CITRATE 0.05 MG/ML IJ SOLN
INTRAMUSCULAR | Status: AC
  Filled 2014-05-28: qty 5

## 2014-05-28 MED ORDER — KCL IN DEXTROSE-NACL 20-5-0.9 MEQ/L-%-% IV SOLN
INTRAVENOUS | Status: DC
  Administered 2014-05-28: 20:00:00 via INTRAVENOUS
  Filled 2014-05-28 (×4): qty 1000

## 2014-05-28 MED ORDER — BUPIVACAINE-EPINEPHRINE 0.25% -1:200000 IJ SOLN
INTRAMUSCULAR | Status: AC
  Filled 2014-05-28: qty 1

## 2014-05-28 MED ORDER — DEXAMETHASONE SODIUM PHOSPHATE 10 MG/ML IJ SOLN
INTRAMUSCULAR | Status: DC | PRN
  Administered 2014-05-28: 10 mg via INTRAVENOUS

## 2014-05-28 MED ORDER — UNJURY CHOCOLATE CLASSIC POWDER
2.0000 [oz_av] | Freq: Four times a day (QID) | ORAL | Status: DC
  Filled 2014-05-28 (×4): qty 27

## 2014-05-28 MED ORDER — MIDAZOLAM HCL 5 MG/5ML IJ SOLN
INTRAMUSCULAR | Status: DC | PRN
  Administered 2014-05-28: 2 mg via INTRAVENOUS

## 2014-05-28 MED ORDER — ROCURONIUM BROMIDE 100 MG/10ML IV SOLN
INTRAVENOUS | Status: DC | PRN
  Administered 2014-05-28: 40 mg via INTRAVENOUS

## 2014-05-28 MED ORDER — MORPHINE SULFATE 10 MG/ML IJ SOLN: 2.0000 mg | INTRAMUSCULAR | Status: DC | PRN

## 2014-05-28 MED ORDER — SCOPOLAMINE 1 MG/3DAYS TD PT72
1.0000 | MEDICATED_PATCH | Freq: Once | TRANSDERMAL | Status: DC
  Administered 2014-05-28: 1.5 mg via TRANSDERMAL
  Filled 2014-05-28: qty 1

## 2014-05-28 MED ORDER — ONDANSETRON HCL 4 MG/2ML IJ SOLN: 4.0000 mg | INTRAMUSCULAR | Status: DC | PRN

## 2014-05-28 MED ORDER — UNJURY VANILLA POWDER
2.0000 [oz_av] | Freq: Four times a day (QID) | ORAL | Status: DC
  Filled 2014-05-28 (×4): qty 27

## 2014-05-28 MED ORDER — ROCURONIUM BROMIDE 100 MG/10ML IV SOLN
INTRAVENOUS | Status: AC
  Filled 2014-05-28: qty 1

## 2014-05-28 MED ORDER — LACTATED RINGERS IV SOLN
INTRAVENOUS | Status: DC
  Administered 2014-05-28: 1000 mL via INTRAVENOUS

## 2014-05-28 SURGICAL SUPPLY — 53 items
BAND LAP 10.0 W/TUBES (Band) ×2 IMPLANT
BLADE HEX COATED 2.75 (ELECTRODE) ×2 IMPLANT
BLADE SURG 15 STRL LF DISP TIS (BLADE) ×1 IMPLANT
BLADE SURG 15 STRL SS (BLADE) ×1
BLADE SURG SZ11 CARB STEEL (BLADE) ×2 IMPLANT
CABLE HIGH FREQUENCY MONO STRZ (ELECTRODE) IMPLANT
CANISTER SUCT 3000ML (MISCELLANEOUS) IMPLANT
DECANTER SPIKE VIAL GLASS SM (MISCELLANEOUS) ×2 IMPLANT
DEVICE SUT QUICK LOAD TK 5 (STAPLE) ×6 IMPLANT
DEVICE SUT TI-KNOT TK 5X26 (MISCELLANEOUS) ×2 IMPLANT
DEVICE SUTURE ENDOST 10MM (ENDOMECHANICALS) IMPLANT
DRAPE CAMERA CLOSED 9X96 (DRAPES) ×2 IMPLANT
DRAPE UTILITY XL STRL (DRAPES) ×4 IMPLANT
ELECT REM PT RETURN 9FT ADLT (ELECTROSURGICAL) ×2
ELECTRODE REM PT RTRN 9FT ADLT (ELECTROSURGICAL) ×1 IMPLANT
GLOVE SS BIOGEL STRL SZ 7.5 (GLOVE) ×1 IMPLANT
GLOVE SUPERSENSE BIOGEL SZ 7.5 (GLOVE) ×1
GOWN STRL REUS W/TWL XL LVL3 (GOWN DISPOSABLE) ×8 IMPLANT
HOVERMATT SINGLE USE (MISCELLANEOUS) ×2 IMPLANT
KIT BASIN OR (CUSTOM PROCEDURE TRAY) ×2 IMPLANT
LIQUID BAND (GAUZE/BANDAGES/DRESSINGS) ×2 IMPLANT
MANIFOLD NEPTUNE II (INSTRUMENTS) ×2 IMPLANT
MESH HERNIA 1X4 RECT BARD (Mesh General) ×1 IMPLANT
MESH HERNIA BARD 1X4 (Mesh General) ×1 IMPLANT
NEEDLE SPNL 22GX3.5 QUINCKE BK (NEEDLE) ×2 IMPLANT
NS IRRIG 1000ML POUR BTL (IV SOLUTION) ×2 IMPLANT
PACK UNIVERSAL I (CUSTOM PROCEDURE TRAY) ×2 IMPLANT
PENCIL BUTTON HOLSTER BLD 10FT (ELECTRODE) ×2 IMPLANT
SET IRRIG TUBING LAPAROSCOPIC (IRRIGATION / IRRIGATOR) IMPLANT
SHEARS HARMONIC ACE PLUS 36CM (ENDOMECHANICALS) IMPLANT
SLEEVE ADV FIXATION 5X100MM (TROCAR) ×6 IMPLANT
SLEEVE XCEL OPT CAN 5 100 (ENDOMECHANICALS) IMPLANT
SOLUTION ANTI FOG 6CC (MISCELLANEOUS) ×2 IMPLANT
SPONGE LAP 18X18 X RAY DECT (DISPOSABLE) ×2 IMPLANT
STAPLER VISISTAT 35W (STAPLE) ×2 IMPLANT
SUT DEVICE BRAIDED 2.0X39 (SUTURE) ×6 IMPLANT
SUT ETHIBOND 2 0 SH (SUTURE) ×3
SUT ETHIBOND 2 0 SH 36X2 (SUTURE) ×3 IMPLANT
SUT MNCRL AB 4-0 PS2 18 (SUTURE) ×4 IMPLANT
SUT PROLENE 2 0 CT2 30 (SUTURE) ×2 IMPLANT
SUT SILK 0 (SUTURE) ×1
SUT SILK 0 30XBRD TIE 6 (SUTURE) ×1 IMPLANT
SUT SURGIDAC NAB ES-9 0 48 120 (SUTURE) IMPLANT
SUT VIC AB 2-0 SH 27 (SUTURE) ×1
SUT VIC AB 2-0 SH 27X BRD (SUTURE) ×1 IMPLANT
SYR 20CC LL (SYRINGE) ×4 IMPLANT
TOWEL OR 17X26 10 PK STRL BLUE (TOWEL DISPOSABLE) ×2 IMPLANT
TOWEL OR NON WOVEN STRL DISP B (DISPOSABLE) ×2 IMPLANT
TROCAR ADV FIXATION 5X100MM (TROCAR) ×2 IMPLANT
TROCAR BLADELESS 15MM (ENDOMECHANICALS) ×2 IMPLANT
TROCAR BLADELESS OPT 5 100 (ENDOMECHANICALS) ×2 IMPLANT
TUBE CALIBRATION LAPBAND (TUBING) ×2 IMPLANT
TUBING INSUFFLATION 10FT LAP (TUBING) ×2 IMPLANT

## 2014-05-28 NOTE — Transfer of Care (Signed)
Immediate Anesthesia Transfer of Care Note  Patient: Kim Lopez  Procedure(s) Performed: Procedure(s): LAPAROSCOPIC GASTRIC BANDING (N/A)  Patient Location: PACU  Anesthesia Type:General  Level of Consciousness: awake, oriented and sedated  Airway & Oxygen Therapy: Patient Spontanous Breathing and Patient connected to face mask oxygen  Post-op Assessment: Report given to PACU RN and Post -op Vital signs reviewed and stable  Post vital signs: Reviewed and stable  Complications: No apparent anesthesia complications

## 2014-05-28 NOTE — Anesthesia Preprocedure Evaluation (Signed)
Anesthesia Evaluation  Patient identified by MRN, date of birth, ID band Patient awake    Reviewed: Allergy & Precautions, H&P , NPO status , Patient's Chart, lab work & pertinent test results  History of Anesthesia Complications (+) PONV and history of anesthetic complications  Airway Mallampati: II  TM Distance: >3 FB Neck ROM: Full    Dental no notable dental hx.    Pulmonary sleep apnea ,  breath sounds clear to auscultation  Pulmonary exam normal       Cardiovascular negative cardio ROS  Rhythm:Regular Rate:Normal     Neuro/Psych Anxiety negative neurological ROS  negative psych ROS   GI/Hepatic negative GI ROS, Neg liver ROS,   Endo/Other  Hypothyroidism Morbid obesity  Renal/GU negative Renal ROS     Musculoskeletal negative musculoskeletal ROS (+)   Abdominal (+) + obese,   Peds  Hematology negative hematology ROS (+)   Anesthesia Other Findings   Reproductive/Obstetrics                             Anesthesia Physical Anesthesia Plan  ASA: III  Anesthesia Plan: General   Post-op Pain Management:    Induction: Intravenous  Airway Management Planned: Oral ETT  Additional Equipment: None  Intra-op Plan:   Post-operative Plan: Extubation in OR  Informed Consent: I have reviewed the patients History and Physical, chart, labs and discussed the procedure including the risks, benefits and alternatives for the proposed anesthesia with the patient or authorized representative who has indicated his/her understanding and acceptance.   Dental advisory given  Plan Discussed with: CRNA  Anesthesia Plan Comments:         Anesthesia Quick Evaluation

## 2014-05-28 NOTE — Op Note (Signed)
Preop diagnosis: Morbid obesity   Postop diagnosis: Same  Surgical procedure: Placement of laparoscopic adjustable gastric band  Surgeon:Eleisha Branscomb M.D.  Assistant.: Ovidio Kinavid Newman  Anesthesia: General  Complications: None  EBL: Minimal  Description of procedure: Patient is brought to the operating room, placed in the supine position on the operating table and general anesthesia was induced. Patient did receive preoperative IV antibiotics and subcutaneous heparin and PAS were in place. The abdomen was widely sterilely prepped and draped and correct patient and procedure verified with the patient time out. Trocar sites were infiltrated with local anesthesia. Access was obtained with a 5 mm Optiview trocar in the left upper quadrant without difficulty and pneumoperitoneum was established. There was no evidence of trocar injury. Under direct vision a 5 mm trocar was placed laterally in the right upper quadrant, a 15 mm trocar in the right upper quadrant midclavicular line, and an 5 mm trocar just above and to the left of the umbilicus for the camera port. The patient was placed in steep reverse Trendelenburg position and and through a 5 mm subxiphoid site the [facility] retractor was placed in the left lobe of the liver elevated with actual exposure of the hiatus and upper stomach. Initially the peritoneum over the left crus was incised and careful blunt dissection carried back down along the left crus toward the retrogastric area. The pars flaccida was opened in an avascular area in the base of the right crus at the area of crossing fat was exposed. The peritoneum here was incised and the finger dissector was passed into this space and advanced retrogastric without difficulty and deployed up through the previously dissected area at the angle of Hiss. A flushed AP Standard lap band system was introduced into the abdomen. The tubing was placed in the finger dissector and brought retrogastric and the  band brought back through the retrogastric tunnel without difficulty. With the sizing tube placed orally into the stomach the band was locked into position without any undue tension and the sizing tube was removed. Holding the tubing toward the patient's feet exposing the small gastric pouch the fundus was sutured up over the band to the pouch with 3 interrupted 2-0 Ethibond sutures. The band appeared to be in excellent position. There was no evidence of bleeding or trocar injury or other problems. The Nathanson retractor was removed under direct vision. All CO2 was evacuated and trochars removed. The tubing which had been brought through the right mid abdominal trocar site was cut and attached to the port to which a piece of Prolene mesh had been sutured to the back. This incision was lengthened slightly in a subcutaneous pocket created. The port was positioned subcutaneously and the tubing introduced bluntly into the abdomen. This incision was closed with subcutaneous running 2-0 Vicryl and all skin incisions were closed with subcutaneous 4-0 Monocryl and Dermabond. Sponge needle and instrument counts were correct. The patient was taken to the PACU in good condition.  Mariella SaaBenjamin T Markia Kyer MD, FACS  04/27/2011, 9:11 AM

## 2014-05-28 NOTE — Interval H&P Note (Signed)
History and Physical Interval Note:  05/28/2014 10:50 AM  Kim GrapesKelly R Veley  has presented today for surgery, with the diagnosis of Morbid Obesity  The various methods of treatment have been discussed with the patient and family. After consideration of risks, benefits and other options for treatment, the patient has consented to  Procedure(s): LAPAROSCOPIC GASTRIC BANDING (N/A) as a surgical intervention .  The patient's history has been reviewed, patient examined, no change in status, stable for surgery.  I have reviewed the patient's chart and labs.  Questions were answered to the patient's satisfaction.     Kam Rahimi T

## 2014-05-28 NOTE — Progress Notes (Signed)
Report Called to K. Barcey RN.

## 2014-05-28 NOTE — Progress Notes (Signed)
Skip EstimableLaurie Deaton, RN, in talking with patient, pt. C/o nausea , medication given for nausea, Skip EstimableLaurie Deaton Rn updated Dr. Johna SheriffHoxworth on pt's status, Orders received for pt.  stay overnight.

## 2014-05-28 NOTE — Anesthesia Postprocedure Evaluation (Signed)
  Anesthesia Post-op Note  Patient: Kim GrapesKelly R Ganesh  Procedure(s) Performed: Procedure(s) (LRB): LAPAROSCOPIC GASTRIC BANDING (N/A)  Patient Location: PACU  Anesthesia Type: General  Level of Consciousness: awake and alert   Airway and Oxygen Therapy: Patient Spontanous Breathing  Post-op Pain: mild  Post-op Assessment: Post-op Vital signs reviewed, Patient's Cardiovascular Status Stable, Respiratory Function Stable, Patent Airway and No signs of Nausea or vomiting  Last Vitals:  Filed Vitals:   05/28/14 1445  BP:   Pulse: 88  Temp:   Resp: 12    Post-op Vital Signs: stable   Complications: No apparent anesthesia complications

## 2014-05-28 NOTE — H&P (Signed)
History of Present Illness Kim Lopez(Kim Lopez; 05/17/2014 11:57 AM) Patient words: baritric f/u.  The patient is a 33 year old female who presents with obesity. Patient returns for her preop visit prior to planned lap band placement. She generally has been feeling well without intercurrent illness. We reviewed her preoperative workup. No concerns on psychologic or nutrition evaluation. Upper GI series was normal with no evidence of hiatal hernia or reflux. Lab work was unremarkable except for elevated cholesterol and lipids.   Other Problems Kim Lopez(Kim Lopez, Kim Lopez; 05/17/2014 11:39 AM) Anxiety Disorder Back Pain Depression Gastroesophageal Reflux Disease Hypercholesterolemia Sleep Apnea Thyroid Disease  Diagnostic Studies History Kim Lopez(Kim Lopez, Kim Lopez; 05/17/2014 11:39 AM) Colonoscopy never Mammogram never Pap Smear 1-5 years ago  Allergies Kim Lopez(Kim Lopez, Kim Lopez; 05/17/2014 11:40 AM) No Known Drug Allergies12/09/2013  Medication History (Kim Lopez, Kim Lopez; 05/17/2014 11:40 AM) Levothyroxine Sodium (125MCG Tablet, Oral) Active.  Social History Kim Lopez(Kim Lopez, Kim Lopez; 05/17/2014 11:39 AM) Alcohol use Moderate alcohol use. Caffeine use Carbonated beverages. No drug use Tobacco use Never smoker.  Family History Kim Lopez(Kim Lopez, Kim Lopez; 05/17/2014 11:39 AM) Heart Disease Mother. Thyroid problems Mother, Sister.  Pregnancy / Birth History Kim Lopez(Kim Lopez, Kim Lopez; 05/17/2014 11:39 AM) Age at menarche 11 years. Contraceptive History Oral contraceptives. Gravida 1 Maternal age 34-30 Para 1 Regular periods  Review of Systems Kim Lopez(Kim Lopez Kim Lopez; 05/17/2014 11:39 AM) General Present- Fatigue. Not Present- Appetite Loss, Chills, Fever, Night Sweats, Weight Gain and Weight Loss. Skin Not Present- Change in Wart/Mole, Dryness, Hives, Jaundice, New Lesions, Non-Healing Wounds, Rash and Ulcer. HEENT Present- Sore Throat. Not Present- Earache, Hearing Loss, Hoarseness, Nose Bleed, Oral  Ulcers, Ringing in the Ears, Seasonal Allergies, Sinus Pain, Visual Disturbances, Wears glasses/contact lenses and Yellow Eyes. Respiratory Present- Snoring. Not Present- Bloody sputum, Chronic Cough, Difficulty Breathing and Wheezing. Breast Not Present- Breast Mass, Breast Pain, Nipple Discharge and Skin Changes. Cardiovascular Not Present- Chest Pain, Difficulty Breathing Lying Down, Leg Cramps, Palpitations, Rapid Heart Rate, Shortness of Breath and Swelling of Extremities. Gastrointestinal Not Present- Abdominal Pain, Bloating, Bloody Stool, Change in Bowel Habits, Chronic diarrhea, Constipation, Difficulty Swallowing, Excessive gas, Gets full quickly at meals, Hemorrhoids, Indigestion, Nausea, Rectal Pain and Vomiting. Female Genitourinary Not Present- Frequency, Nocturia, Painful Urination, Pelvic Pain and Urgency. Musculoskeletal Not Present- Back Pain, Joint Pain, Joint Stiffness, Muscle Pain, Muscle Weakness and Swelling of Extremities. Neurological Present- Headaches. Not Present- Decreased Memory, Fainting, Numbness, Seizures, Tingling, Tremor, Trouble walking and Weakness. Psychiatric Not Present- Anxiety, Bipolar, Change in Sleep Pattern, Depression, Fearful and Frequent crying. Endocrine Not Present- Cold Intolerance, Excessive Hunger, Hair Changes, Heat Intolerance, Hot flashes and New Diabetes. Hematology Not Present- Easy Bruising, Excessive bleeding, Gland problems, HIV and Persistent Infections.   Vitals (Kim Lopez Kim Lopez; 05/17/2014 11:40 AM) 05/17/2014 11:40 AM Weight: 231 lb Height: 62in Body Surface Area: 2.14 m Body Mass Index: 42.25 kg/m Temp.: 97.74F(Temporal)  Pulse: 78 (Regular)  BP: 134/76 (Sitting, Left Arm, Standard)    Physical Exam Kim Lopez(Kim Lopez; 05/17/2014 11:58 AM) The physical exam findings are as follows: Note:General: Alert, morbidly obese Caucasian female, in no distress Skin: Warm and dry without rash or infection. HEENT: No  palpable masses or thyromegaly. Sclera nonicteric. Pupils equal round and reactive. Lungs: Breath sounds clear and equal. No wheezing or increased work of breathing. Cardiovascular: Regular rate and rhythm without murmer. No JVD or edema. Peripheral pulses intact. No carotid bruits. Abdomen: Nondistended. Soft and nontender. No masses palpable. No organomegaly. No palpable hernias. Extremities: No edema or joint  swelling or deformity. No chronic venous stasis changes. Neurologic: Alert and fully oriented. Gait normal. No focal weakness. Psychiatric: Normal mood and affect. Thought content appropriate with normal judgement and insight    Assessment & Plan Kim Lopez(Kim Lopez; 05/17/2014 12:00 PM) OBESITY, MORBID, BMI 40.0-49.9 (278.01  E66.01) Impression: We reviewed the consent form line by line and all her questions were answered. She is given prescription for her pain medication. She is starting her preoperative diet. Ready to proceed with planned lap band placement. Current Plans  Started OxyCODONE HCl 5MG /5ML, 5-10 Milliliter every four hours, as needed, 200 Milliliter, 05/17/2014, No Refill.

## 2014-05-29 ENCOUNTER — Encounter (HOSPITAL_COMMUNITY): Payer: Self-pay | Admitting: General Surgery

## 2014-05-29 NOTE — Discharge Summary (Signed)
   Patient ID: Kim Lopez [id number] 33 y.o. [birth year]  05/28/2014  Discharge date and time: 05/29/2014   Admitting Physician: Glenna FellowsHOXWORTH,Evelynn Hench T  Discharge Physician: Glenna FellowsHOXWORTH,Ouita Nish T  Admission Diagnoses: Morbid Obesity  Discharge Diagnoses: Same  Operations: Procedure(s): LAPAROSCOPIC GASTRIC BANDING  Admission Condition: good  Discharged Condition: good  Indication for Admission: Patient is a 33 year old female with progressive morbid obesity unresponsive to medical management. She presents with comorbidities of sleep apnea. BMI 41. Extensive preoperative workup and discussion detailed elsewhere she is electively brought to the hospital for lap band placement.  Hospital Course: On the morning of admission the patient underwent an uneventful lap band placement. We had planned outpatient but she had significant persistent nausea on the afternoon of surgery and was admitted for IV fluids and nausea control. The following morning her nausea is resolved. No complaints. Vital signs are within normal limits. Abdomen is soft and nontender wounds healing well. She is felt ready for discharge. KUB shows the band within normal position.   Disposition: Home  Patient Instructions:    Medication List    TAKE these medications        levothyroxine 125 MCG tablet  Commonly known as:  SYNTHROID, LEVOTHROID  Take 125 mcg by mouth daily before breakfast.     NOREL AD 4-10-325 MG Tabs  Generic drug:  Chlorphen-PE-Acetaminophen  Take 1 tablet by mouth 3 (three) times daily as needed. FOR SINUSITIS     oxyCODONE 5 MG/5ML solution  Commonly known as:  ROXICODONE  Take 5-10 mLs (5-10 mg total) by mouth every 4 (four) hours as needed.     ZITHROMAX Z-PAK 250 MG tablet  Generic drug:  azithromycin  - Take by mouth daily. STARTED 05/22/14   TAKE 2 TABS ON DAY ONE, THEN TAKE 1 TAB ONCE A DAY FOR DAYS TWO-FIVE  - FOR SINUSITIS        Activity: activity as tolerated Diet:  bariatric protein shakes Wound Care: none needed  Follow-up:  With Dr. Johna SheriffHoxworth in 3 weeks.  Signed: Mariella SaaBenjamin T Jeriann Sayres MD, FACS  05/29/2014, 8:06 AM

## 2014-05-29 NOTE — Plan of Care (Signed)
Problem: Food- and Nutrition-Related Knowledge Deficit (NB-1.1) Goal: Nutrition education Formal process to instruct or train a patient/client in a skill or to impart knowledge to help patients/clients voluntarily manage or modify food choices and eating behavior to maintain or improve health. Outcome: Completed/Met Date Met:  05/29/14 Nutrition Education Note  Received consult for diet education per DROP protocol.   S/p 12/15 Procedure(s): LAPAROSCOPIC GASTRIC BANDING    Discussed 2 week post op diet with pt. Emphasized that liquids must be non carbonated, non caffeinated, and sugar free. Fluid goals discussed. Pt to follow up with outpatient bariatric RD for further diet progression after 2 weeks. Multivitamins and minerals also reviewed. Teach back method used, pt expressed understanding, expect good compliance.   Diet: First 2 Weeks  You will see the nutritionist about two (2) weeks after your surgery. The nutritionist will increase the types of foods you can eat if you are handling liquids well:  If you have severe vomiting or nausea and cannot handle clear liquids lasting longer than 1 day, call your surgeon  Protein Shake  Drink at least 2 ounces of shake 5-6 times per day  Each serving of protein shakes (usually 8 - 12 ounces) should have a minimum of:  15 grams of protein  And no more than 5 grams of carbohydrate  Goal for protein each day:  Men = 80 grams per day  Women = 60 grams per day  Protein powder may be added to fluids such as non-fat milk or Lactaid milk or Soy milk (limit to 35 grams added protein powder per serving)   Hydration  Slowly increase the amount of water and other clear liquids as tolerated (See Acceptable Fluids)  Slowly increase the amount of protein shake as tolerated  Sip fluids slowly and throughout the day  May use sugar substitutes in small amounts (no more than 6 - 8 packets per day; i.e. Splenda)   Fluid Goal  The first goal is to drink at  least 8 ounces of protein shake/drink per day (or as directed by the nutritionist); some examples of protein shakes are [employer], [employer], EAS Edge HP, and Unjury. See handout from pre-op Bariatric Education Class:  Slowly increase the amount of protein shake you drink as tolerated  You may find it easier to slowly sip shakes throughout the day  It is important to get your proteins in first  Your fluid goal is to drink 64 - 100 ounces of fluid daily  It may take a few weeks to build up to this  32 oz (or more) should be clear liquids  And  32 oz (or more) should be full liquids (see below for examples)  Liquids should not contain sugar, caffeine, or carbonation   Clear Liquids:  Water or Sugar-free flavored water (i.e. Fruit H2O, Propel)  Decaffeinated coffee or tea (sugar-free)  Crystal Lite, Wyler's Lite, Minute Maid Lite  Sugar-free Jell-O  Bouillon or broth  Sugar-free Popsicle: *Less than 20 calories each; Limit 1 per day   Full Liquids:  Protein Shakes/Drinks + 2 choices per day of other full liquids  Full liquids must be:  No More Than 12 grams of Carbs per serving  No More Than 3 grams of Fat per serving  Strained low-fat cream soup  Non-Fat milk  Fat-free Lactaid Milk  Sugar-free yogurt (Dannon Lite & Fit, Greek yogurt)     Clayton Bibles, MS, RD, LDN Pager: [phone] After Hours Pager: [phone]

## 2014-05-29 NOTE — Progress Notes (Signed)
Patient is alert and oriented.  Pain is controlled, and patient is tolerating fluids.  advanced to protein shake today patient tolerated well.  Reviewed Adjustable gastric band discharge instructions with patient, patient able to articulate understanding.  Provided information on BELT program, Support Group and [facility] outpatient pharmacy. All questions answered, will continue to monitor.

## 2014-05-29 NOTE — Discharge Instructions (Signed)
Per bariatric nurse                   ADJUSTABLE GASTRIC BAND  Home Care Instructions   These instructions are to help you care for yourself when you go home.  Call: If you have any problems.  Call [phone] and ask for the surgeon on call  If you need immediate assistance come [facility] the [facility]. Tell the ER staff you are a new post-op gastric banding patient  Signs and symptoms to report:  Severe  vomiting or nausea o If you cannot handle clear liquids for longer than 1 day, call your surgeon  Abdominal pain which does not get better after taking your pain medication  Fever greater than 100.4  F and chills  Heart rate over 100 beats a minute  Trouble breathing  Chest pain  Redness,  swelling, drainage, or foul odor at incision (surgical) sites  If your incisions open or pull apart  Swelling or pain in calf (lower leg)  Diarrhea (Loose bowel movements that happen often), frequent watery, uncontrolled bowel movements  Constipation, (no bowel movements for 3 days) if this happens: o Take Milk of Magnesia, 2 tablespoons by mouth, 3 times a day for 2 days if needed o Stop taking Milk of Magnesia once you have had a bowel movement o Call your doctor if constipation continues Or o Take Miralax  (instead of Milk of Magnesia) following the label instructions o Stop taking Miralax once you have had a bowel movement o Call your doctor if constipation continues  Anything you think is abnormal for you   Normal side effects after surgery:  Unable to sleep at night or unable to concentrate  Irritability  Being tearful (crying) or depressed  These are common complaints, possibly related to your anesthesia, stress of surgery, and change in lifestyle, that usually go away a few weeks after surgery. If these feelings continue, call your medical doctor.  Wound Care: You may have surgical glue, steri-strips, or staples over your incisions after surgery  Surgical  glue: Looks like clear film over your incisions and will wear off a little at a time  Steri-strips: Adhesive strips of tape over your incisions. You may notice a yellowish color on skin under the steri-strips. This is used to make the steri-strips stick better. Do not pull the steri-strips off - let them fall off  Staples: Staples may be removed before you leave the hospital o If you go home with staples, call [facility] for an appointment with your surgeons nurse to have staples removed 10 days after surgery, [phone]  Showering: You may shower two (2) days after your surgery unless your surgeon tells you differently o Wash gently around incisions with warm soapy water, rinse well, and gently pat dry o If you have a drain (tube from your incision), you may need someone to hold this while you shower o No tub baths until staples are removed and incisions are healed   Medications:  Medications should be liquid or crushed if larger than the size of a dime  Extended release pills (medication that releases a little bit at a time through the  day) should not be crushed  Depending on the size and number of medications you take, you may need to space (take a few throughout the day)/change the time you take your medications so that you do not over-fill your pouch (smaller stomach)  Make sure you follow-up with you  primary care physician to make medication changes needed during rapid weight loss and life -style changes  If you have diabetes, follow up with your doctor that orders your diabetes medication(s) within one week after surgery and check your blood sugar regularly   Do not drive while taking narcotics (pain medications)   Do not take acetaminophen (Tylenol) and Roxicet or Lortab Elixir at the same time since these pain medications contain acetaminophen   Diet:  First 2 Weeks You will see the nutritionist about two (2) weeks after your surgery. The nutritionist will  increase the types of foods you can eat if you are handling liquids well:  If you have severe vomiting or nausea and cannot handle clear liquids lasting longer than 1 day call your surgeon For Same Day Surgery Discharge Patients:  The day of surgery drink water only: 2 ounces every 4 hours  If you are handling water, start drinking your high protein shake the next morning For Overnight Stay Patients:  Begin by drinking 2 ounces of a high protein every 3 hours, 5-6 times per day  Slowly increase the amount you drink as tolerated  You may find it easier to slowly sip shakes throughout the day. It is important to get your proteins in first    Protein Shake  Drink at least 2 ounces of shake 5-6 times per day  Each serving of protein shakes (usually 8-12 ounces) should have a minimum of: o 15 grams of protein o And no more than 5 grams of carbohydrate  Goal for protein each day: o Men = 80 grams per day o Women = 60 grams per day  Protein powder may be added to fluids such as non-fat milk or Lactaid milk or Soy milk (limit to 35 grams added protein powder per serving)  Hydration  Slowly increase the amount of water and other clear liquids as tolerated (See Acceptable Fluids)  Slowly increase the amount of protein shake as tolerated  Sip fluids slowly and throughout the day  May use sugar substitutes in small amounts (no more than 6-8 packets per day; i.e. Splenda)  Fluid Goal  The first goal is to drink at least 8 ounces of protein shake/drink per day (or as directed by the nutritionist); some examples of protein shakes are Syntrax, Nectar, Dillard's, EAS Edge HP, and Unjury. - See handout from pre-op Bariatric Education Class: o Slowly increase the amount of protein shake you drink as tolerated o You may find it easier to slowly sip shakes throughout the day o It is important to get your proteins in first  Your fluid goal is to drink 64-100 ounces of fluid  daily o It may take a few weeks to build up to this   32 oz. (or more) should be full liquids (see below for examples)  Liquids should not contain sugar, caffeine, or carbonation  Clear Liquids:  Water of Sugar-free flavored water (i.e. Fruit HO, Propel)  Decaffeinated coffee or tea (sugar-free)  Crystal lite, Wylers Lite, Minute Maid Lite  Sugar-free Jell-O  Bouillon or broth  Sugar-free Popsicle:    - Less than 20 calories each; Limit 1 per day        Full Liquids:                   Protein Shakes/Drinks + 2 choices per day of other full liquids  Full liquids must be: o No More Than 12 grams of Carbs per serving o No More  Than 3 grams of Fat per serving  Strained low-fat cream soup  Non-Fat milk  Fat-free Lactaid Milk  Sugar-free yogurt (Dannon Lite & Fit, Greek yogurt)   Vitamins and Minerals  Start 1 day after surgery unless otherwise directed by your surgeon  1 Chewable Multivitamin / Multimineral Supplement with iron (i.e. Centrum for Adults)  Chewable Calcium Citrate with Vitamin D-3 (Example: 3 Chewable Calcium  Plus 600 with vitamin D-3) o Take 500 mg three (3) times a day for a total of 1500 mg per day o Do not take all 3 doses of calcium at one time as it may cause constipation, and you can only absorb 500 mg at a time o Do not mix multivitamins containing iron with calcium supplements;  take 2 hours apart o Do not substitute Tums (calcium carbonate) for your calcium  Menstruating women and those at risk for anemia ( a blood disease that causes weakness) may need extra iron o Talk to your doctor to see if you need more iron  If you need extra iron: total daily iron recommendation (including Vitamins) is 50 to 100 mg Iron/day  Do not stop taking or change any vitamins or minerals until you talk to your nutritionist or surgeon  Your nutritionist and/or surgeon must approve all vitamin and mineral supplements  Activity and Exercise: It is  important to continue walking at home. Limit your physical activity as instructed by your doctor. During this time, use these guidelines:  Do not lift anything greater than ten  (10) pounds for at least two (2) weeks  Do not go back to work or drive until Designer, industrial/productyour surgeon says you can  You may have sex when you feel comfortable o It is VERY important for female patients to use a reliable birth control method; fertility often increase after surgery o Do not get pregnant for at least 18 months  Start exercising as soon as your doctor tells you that you can o Make sure your doctor approves any physical activity  Start with a simple walking program  Walk 5-15 minutes each day, 7 days per week  Slowly increase until you are walking 30-45 minutes per day  Consider joining our BELT program. [phone] or email [email or web address]    Special Instructions  Things to remember:  Free counseling is available for you and your family through collaboration between [facility] and [facility]. Please call [phone] and leave a message  Use your CPAP when sleeping if this applies to you  Consider buying a medical alert bracelet that says you had lap-band surgery  You will likely have your first fill (fluid added to your band) 6 - 8 weeks after surgery  [facility] has a free Bariatric Surgery Support Group that meets monthly, the 3rd Thursday, 6pm. [facility] Classrooms. You can see classes online at [email or web address]  It is very important to keep all follow up appointments with your surgeon, nutritionist, primary care physician, and behavioral health practitioner o After the first year, please follow up with your bariatric surgeon and nutritionist at least once a year in order to maintain best weight loss results                    [facility]:  [phone]               [facility]: [phone]  Bariatric Nurse Coordinator: [phone]      Adjustable Gastric Band Home Care Instructions  Rev. 07/2012                                                                  Reviewed and Endorsed                                                   by [facility] Patient Education Committee, Jan, 2014

## 2014-05-29 NOTE — Progress Notes (Signed)
Patient tolerating protein shakes.  Discharge instructions given to patient.  No questions at this time

## 2014-05-30 ENCOUNTER — Telehealth (HOSPITAL_COMMUNITY): Payer: Self-pay

## 2014-05-30 NOTE — Telephone Encounter (Signed)
Made discharge phone call to patient per DROP protocol. Asking the following questions.    1. Do you have someone to care for you now that you are home?  yes 2. Are you having pain now that is not relieved by your pain medication?  no 3. Are you able to drink the recommended daily amount of fluids (48 ounces minimum/day) and protein (60-80 grams/day) as prescribed by the dietitian or nutritional counselor?  yes 4. Are you taking the vitamins and minerals as prescribed?  yes 5. Do you have the "on call" number to contact your surgeon if you have a problem or question?  yes 6. Are your incisions free of redness, swelling or drainage? (If steri strips, address that these can fall off, shower as tolerated) one is a little pink but not warm and it is getting better 7. Have your bowels moved since your surgery?  If not, are you passing gas?  yes 8. Are you up and walking 3-4 times per day?  yes    1. Do you have an appointment made to see your surgeon in the next month?  yes 2. Were you provided your discharge medications before your surgery or before you were discharged from the hospital and are you taking them without problem?  yes 3. Were you provided phone numbers to the clinic/surgeon's office?  yes 4. Did you watch the patient education video module in the (clinic, surgeon's office, etc.) before your surgery? yes 5. Do you have a discharge checklist that was provided to you in the hospital to reference with instructions on how to take care of yourself after surgery?  yes 6. Did you see a dietitian or nutritional counselor while you were in the hospital?  yes 7. Do you have an appointment to see a dietitian or nutritional counselor in the next month? yes

## 2014-06-11 ENCOUNTER — Encounter: Payer: Federal, State, Local not specified - PPO | Attending: General Surgery

## 2014-06-11 DIAGNOSIS — Z713 Dietary counseling and surveillance: Secondary | ICD-10-CM | POA: Diagnosis not present

## 2014-06-11 DIAGNOSIS — Z6841 Body Mass Index (BMI) 40.0 and over, adult: Secondary | ICD-10-CM | POA: Diagnosis not present

## 2014-06-12 NOTE — Patient Instructions (Signed)
Goals:  Follow Phase 3A: Soft High Protein Phase  Eat 3-6 small meals/snacks, every 3-5 hrs  Increase lean protein foods to meet 60g goal  Increase fluid intake to 64oz +  Avoid drinking 15 minutes before, during and 30 minutes after eating  Aim for >30 min of physical activity daily per MD 

## 2014-06-12 NOTE — Progress Notes (Signed)
Bariatric Class:  Appt start time: 1530 end time:  1630.  2 Week Post-Operative Nutrition Class  Patient was seen on 06/11/14 for Post-Operative Nutrition [facility].   Surgery date: 05/28/14 Surgery type: LAGB Start weight at [facility] - Eat: 233 lbs on 10/10/13 Weight today: 219 lbs Weight change: 15 lbs  TANITA  BODY COMP RESULTS  05/13/14 06/11/14   BMI (kg/m^2) 42.8 40.1   Fat Mass (lbs) 117.0 101.5   Fat Free Mass (lbs) 117.0 117.5   Total Body Water (lbs) 85.5 86    The following the learning objectives were met by the patient during this course:  Identifies Phase 3A (Soft, High Proteins) Dietary Goals and will begin from 2 weeks post-operatively to 2 months post-operatively  Identifies appropriate sources of fluids and proteins   States protein recommendations and appropriate sources post-operatively  Identifies the need for appropriate texture modifications, mastication, and bite sizes when consuming solids  Identifies appropriate multivitamin and calcium sources post-operatively  Describes the need for physical activity post-operatively and will follow MD recommendations  States when to call healthcare provider regarding medication questions or post-operative complications  Handouts given during class include:  Phase 3A: Soft, High Protein Diet Handout  Band Fill Guidelines Handout  Follow-Up Plan: Patient will follow-up at [facility] in 4 weeks for 6 week post-op nutrition visit for diet advancement per MD.

## 2014-07-10 ENCOUNTER — Encounter: Payer: Federal, State, Local not specified - PPO | Attending: General Surgery | Admitting: Dietician

## 2014-07-10 DIAGNOSIS — Z713 Dietary counseling and surveillance: Secondary | ICD-10-CM | POA: Diagnosis not present

## 2014-07-10 DIAGNOSIS — Z6841 Body Mass Index (BMI) 40.0 and over, adult: Secondary | ICD-10-CM | POA: Diagnosis not present

## 2014-07-10 NOTE — Progress Notes (Signed)
  Follow-up visit:  6 Weeks Post-Operative LAGB Surgery  Medical Nutrition Therapy:  Appt start time: 1400 end time:  1430.  Primary concerns today: Post-operative Bariatric Surgery Nutrition Management.  Tresa EndoKelly returns having lost a total of 28 pounds. She is going to get her 1st fill today. She reports black stools since surgery.   Surgery date: 05/28/14 Surgery type: LAGB [facility]: 233 lbs on 10/10/13 (236 lbs per patient) Weight today: 208 lbs Weight change: 11 lbs Total weight loss: 28 lbs  TANITA  BODY COMP RESULTS  05/13/14 06/11/14 07/10/14   BMI (kg/m^2) 42.8 40.1 38   Fat Mass (lbs) 117.0 101.5 95   Fat Free Mass (lbs) 117.0 117.5 113   Total Body Water (lbs) 85.5 86 82.5    Preferred Learning Style:   No preference indicated   Learning Readiness:   Ready  24-hr recall: B (AM): protein shake: coconut almond milk, PB2, protein powder (30g) Snk (AM):   L (PM): protein shake (30g) Snk (PM):   D (PM): 2 scrambled eggs or 1 egg and 1 Malawiturkey sausage or yogurt (12-14g) Snk (PM):   Fluid intake: ~64 oz water (drinks constantly) Estimated total protein intake: 70-80g  Medications: synthroid only Supplementation: taking  Drinking while eating: no Hair loss: no Carbonated beverages: no N/V/D/C: constipation Dumping syndrome: diarrhea immediately after cheesecake Last Lap-Band fill: 1st fill scheduled today  Recent physical activity:  walking  Progress Towards Goal(s):  In progress.  Handouts given during visit include:  Phase 3B lean protein + non starchy vegetables   Nutritional Diagnosis:  [place]-3.3 Overweight/obesity related to past poor dietary habits and physical inactivity as evidenced by patient w/ recent LAGB surgery following dietary guidelines for continued weight loss.     Intervention:  Nutrition counseling provided.  Teaching Method Utilized:  Visual Auditory  Barriers to learning/adherence to lifestyle change:  none  Demonstrated degree of understanding via:  Teach Back   Monitoring/Evaluation:  Dietary intake, exercise, lap band fills, and body weight. Follow up in 1 months for 3 month post-op visit.

## 2014-07-10 NOTE — Patient Instructions (Signed)
Goals:  Follow Phase 3B: High Protein + Non-Starchy Vegetables  Eat 3-6 small meals/snacks, every 3-5 hrs  Increase lean protein foods to meet 60g goal  Increase fluid intake to 64oz +  Avoid drinking 15 minutes before, during and 30 minutes after eating  Aim for >30 min of physical activity daily   TANITA  BODY COMP RESULTS  05/13/14 06/11/14 07/10/14   BMI (kg/m^2) 42.8 40.1 38   Fat Mass (lbs) 117.0 101.5 95   Fat Free Mass (lbs) 117.0 117.5 113   Total Body Water (lbs) 85.5 86 82.5

## 2014-08-14 ENCOUNTER — Encounter: Payer: Federal, State, Local not specified - PPO | Attending: General Surgery | Admitting: Dietician

## 2014-08-14 DIAGNOSIS — Z713 Dietary counseling and surveillance: Secondary | ICD-10-CM | POA: Insufficient documentation

## 2014-08-14 DIAGNOSIS — Z6841 Body Mass Index (BMI) 40.0 and over, adult: Secondary | ICD-10-CM | POA: Diagnosis not present

## 2014-08-14 NOTE — Progress Notes (Signed)
  Follow-up visit:  2.5 months Post-Operative LAGB Surgery  Medical Nutrition Therapy:  Appt start time: 255 end time:  315  Primary concerns today: Post-operative Bariatric Surgery Nutrition Management.  Getting 2nd fill today; last fill (2ccs) really helped with restriction.  Surgery date: 05/28/14 Surgery type: LAGB [facility]: [facility] lbs on 10/10/13 (236 lbs per patient) Weight today: 199.5 lbs Weight change: 8.5 lbs Total weight loss: 37 lbs  TANITA  BODY COMP RESULTS  05/13/14 06/11/14 07/10/14 08/14/14   BMI (kg/m^2) 42.8 40.1 38 36.5   Fat Mass (lbs) 117.0 101.5 95 86   Fat Free Mass (lbs) 117.0 117.5 113 113.5   Total Body Water (lbs) 85.5 86 82.5 83    Preferred Learning Style:   No preference indicated   Learning Readiness:   Ready  24-hr recall: B (AM): protein shake: coconut almond milk, PB2, protein powder (30g) OR 1 egg and Malawiturkey sausage (14g) Snk (AM):   L (PM): Light and Lively yogurt (15g) Snk (PM):  1/8 cups frozen blueberries  D (PM): 2.5-3.5 oz chicken breast or ground beef with steamed green beans or sugar snap peas (16-25g) Snk (PM):   Fluid intake: doesn't feel like she's getting enough water Estimated total protein intake: 60+g  Medications: synthroid only Supplementation: taking  Drinking while eating: no Hair loss: no Carbonated beverages: no N/V/D/C: constipation improving Dumping syndrome: diarrhea immediately after cheesecake Last Lap-Band fill: 1st fill scheduled today  Recent physical activity:  Gym (15 min of cardio + machines + another 15 min cardio)  2 days a week (10,000 steps every day)  Progress Towards Goal(s):  In progress.  Handouts given during visit include:  Snack list   Nutritional Diagnosis:  [place]-3.3 Overweight/obesity related to past poor dietary habits and physical inactivity as evidenced by patient w/ recent LAGB surgery following dietary guidelines for continued weight loss.     Intervention:   Nutrition counseling provided.  Teaching Method Utilized:  Visual Auditory  Barriers to learning/adherence to lifestyle change: none  Demonstrated degree of understanding via:  Teach Back   Monitoring/Evaluation:  Dietary intake, exercise, lap band fills, and body weight. Follow up in 2-3 months for 5 month post-op visit.

## 2014-08-14 NOTE — Patient Instructions (Addendum)
Goals:  Increase fluid intake to 64oz +  TANITA  BODY COMP RESULTS  05/13/14 06/11/14 07/10/14 08/14/14   BMI (kg/m^2) 42.8 40.1 38 36.5   Fat Mass (lbs) 117.0 101.5 95 86   Fat Free Mass (lbs) 117.0 117.5 113 113.5   Total Body Water (lbs) 85.5 86 82.5 83

## 2015-12-10 IMAGING — RF DG UGI W/ KUB
15 of 24 series · 15 of 24 positions shown · non-contrast
Comparison: None.

CLINICAL DATA: Preop lap band.  Bariatric screening.

EXAM:
UPPER GI SERIES W/ KUB
TECHNIQUE: After obtaining a scout radiograph a routine upper GI series was
performed using thin barium
FLUOROSCOPY TIME:  1 min, 48 seconds

[Series 1: run · 1 of 4 slices shown (1 of 15)]
[im 1/4]
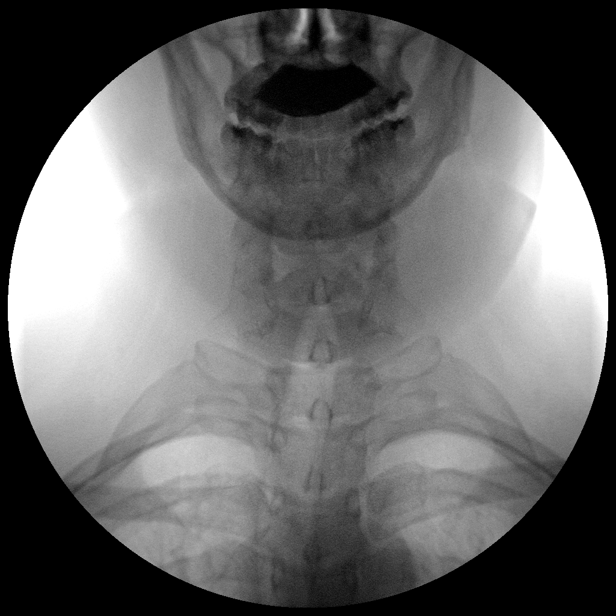

[Series 3: run · 1 of 12 slices shown (2 of 15)]
[im 1/12]
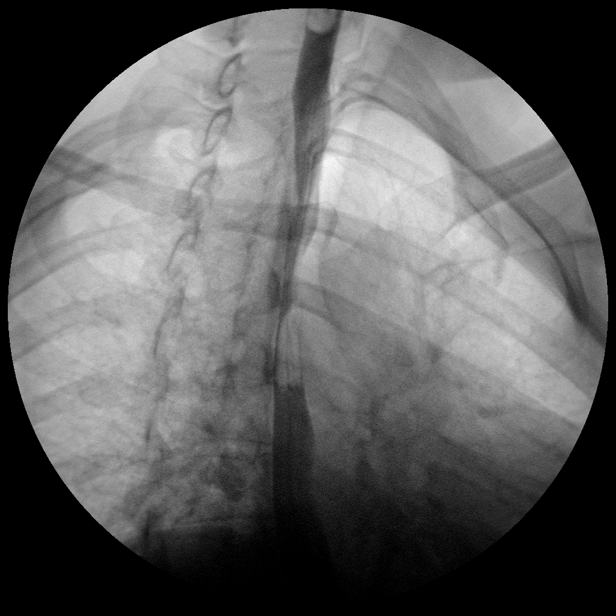

[Series 5: run · 1 of 1 slices shown (3 of 15)]
[im 1/1]
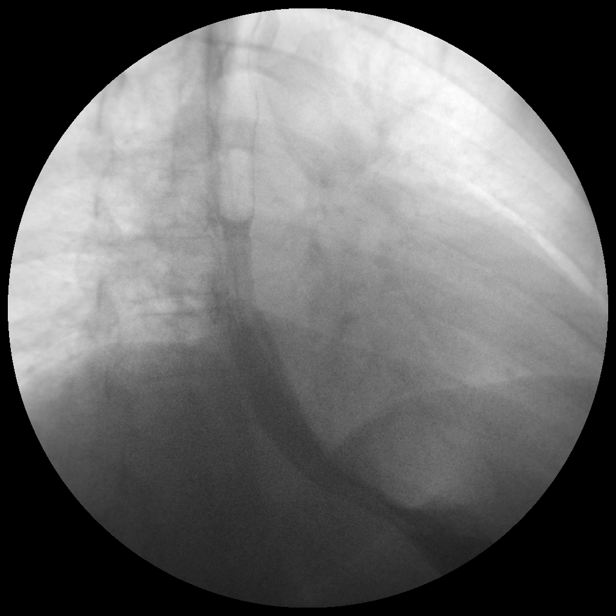

[Series 6: run · 1 of 2 slices shown (4 of 15)]
[im 1/2]
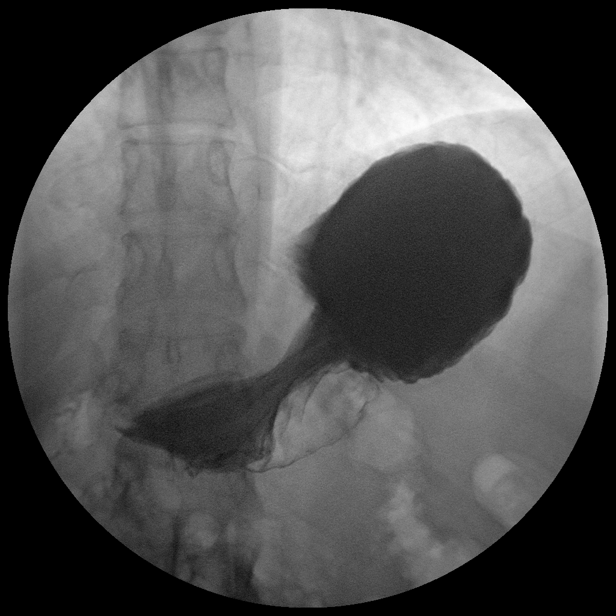

[Series 8: run · 1 of 1 slices shown (5 of 15)]
[im 1/1]
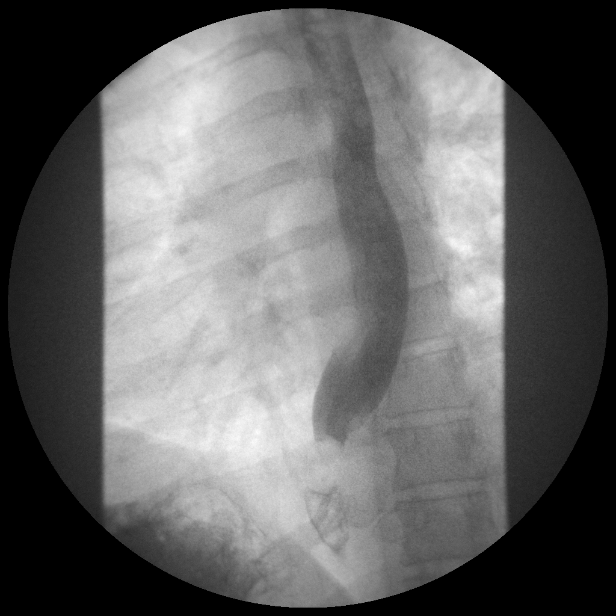

[Series 9: run · 1 of 1 slices shown (6 of 15)]
[im 1/1]
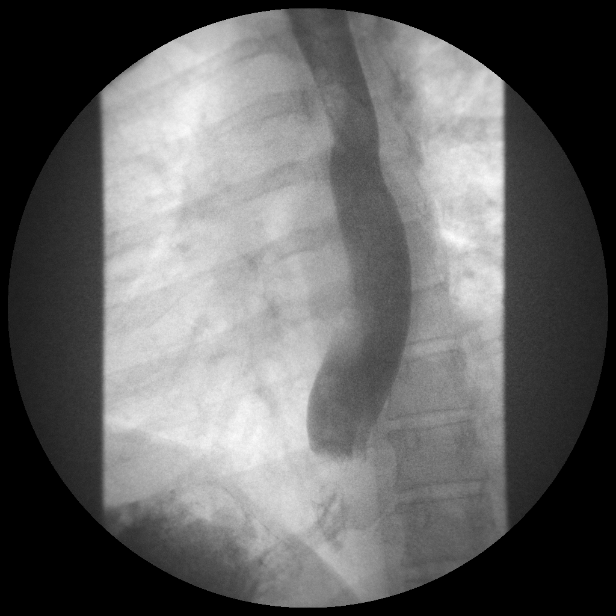

[Series 11: run · 1 of 1 slices shown (7 of 15)]
[im 1/1]
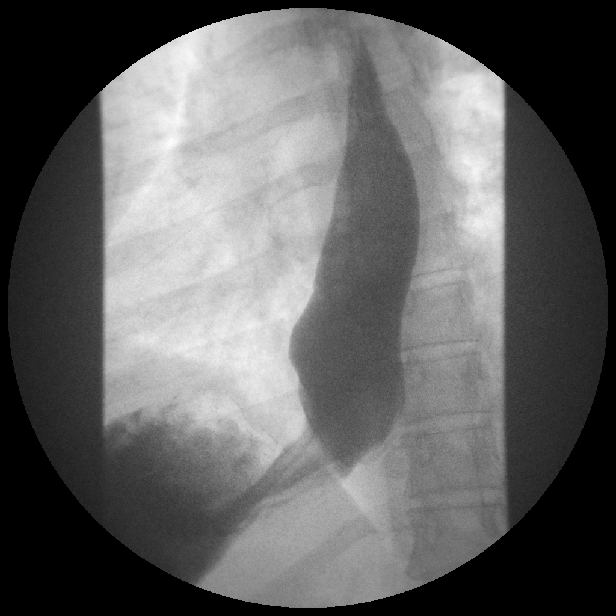

[Series 13: run · 1 of 1 slices shown (8 of 15)]
[im 1/1]
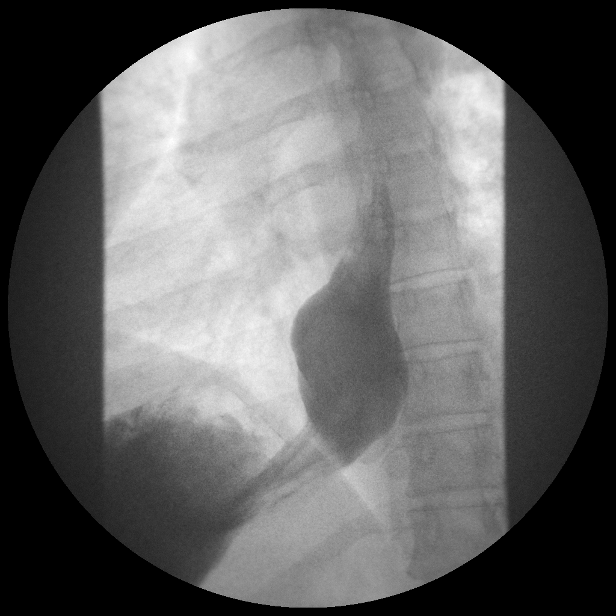

[Series 14: run · 1 of 2 slices shown (9 of 15)]
[im 1/2]
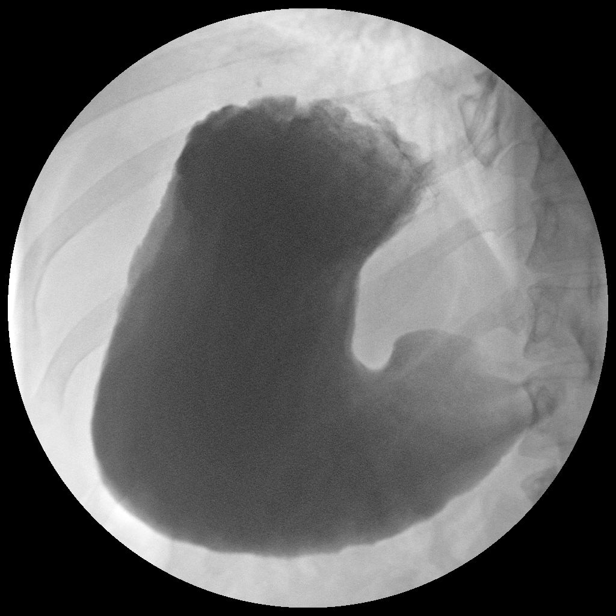

[Series 16: run · 1 of 1 slices shown (10 of 15)]
[im 1/1]
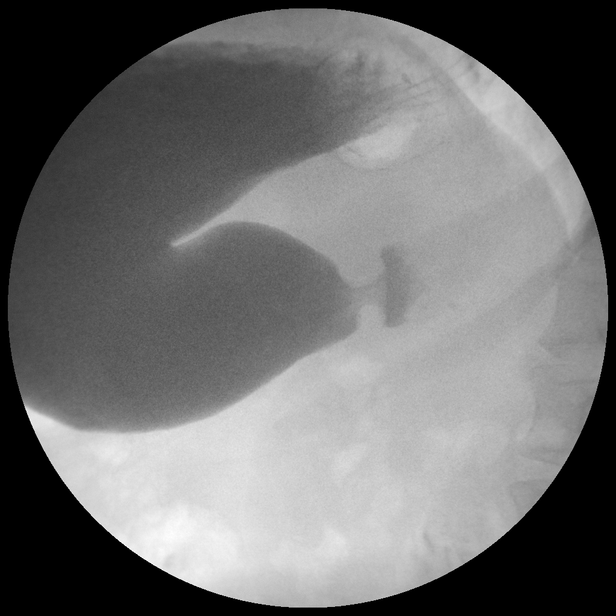

[Series 17: run · 1 of 1 slices shown (11 of 15)]
[im 1/1]
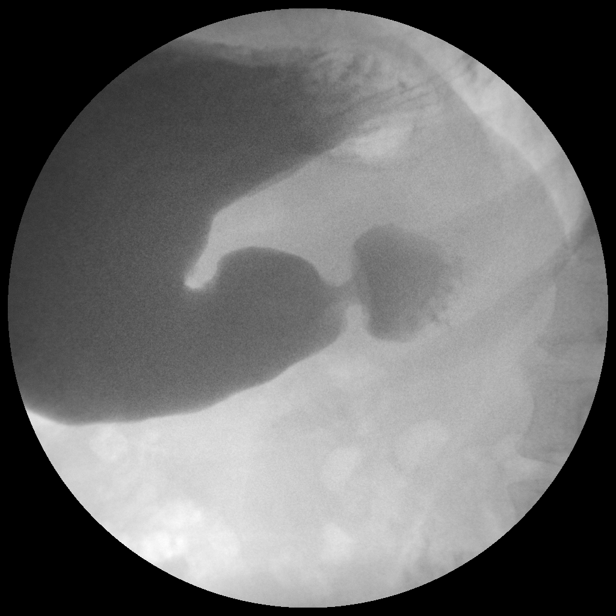

[Series 19: run · 1 of 1 slices shown (12 of 15)]
[im 1/1]
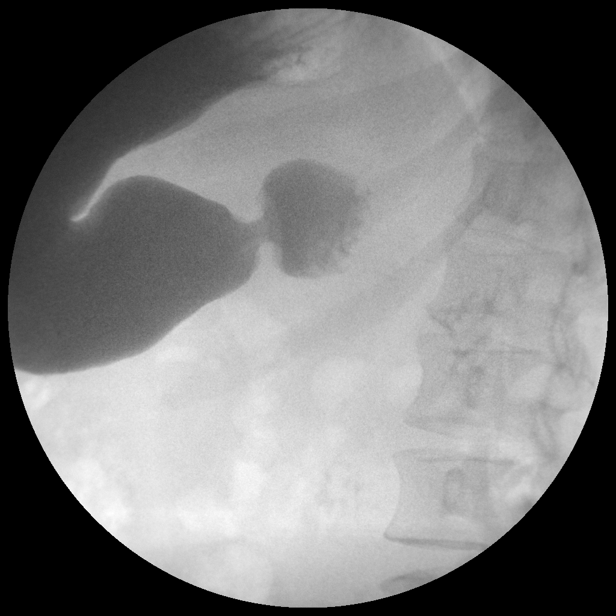

[Series 21: run · 1 of 1 slices shown (13 of 15)]
[im 1/1]
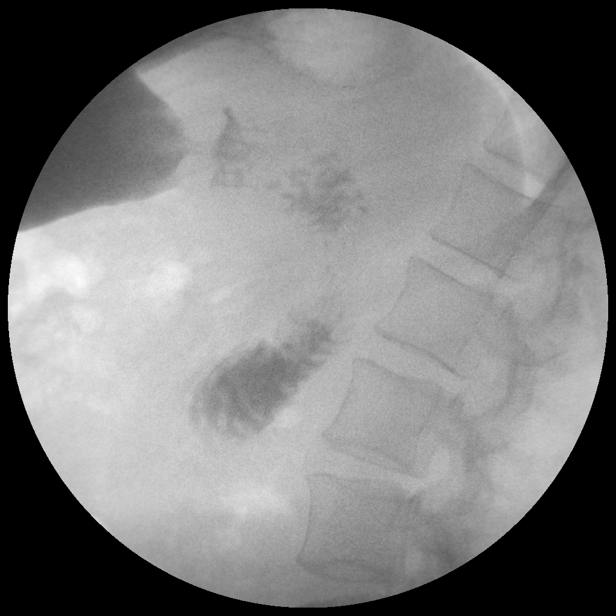

[Series 22: run · 1 of 1 slices shown (14 of 15)]
[im 1/1]
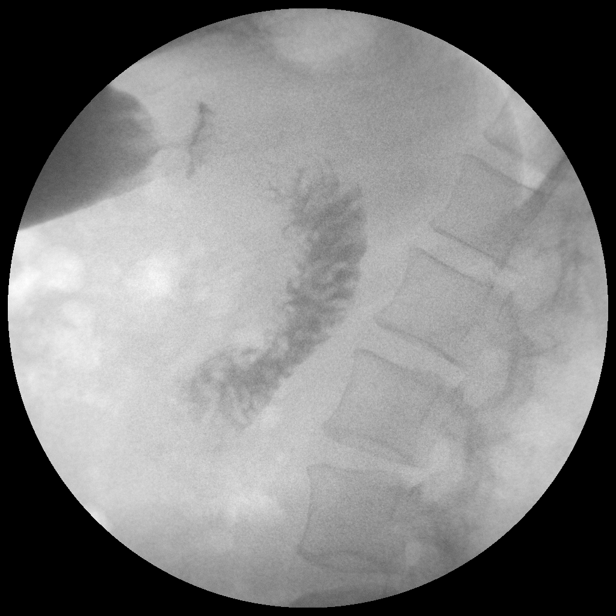

[Series 24: run · 1 of 1 slices shown (15 of 15)]
[im 1/1]
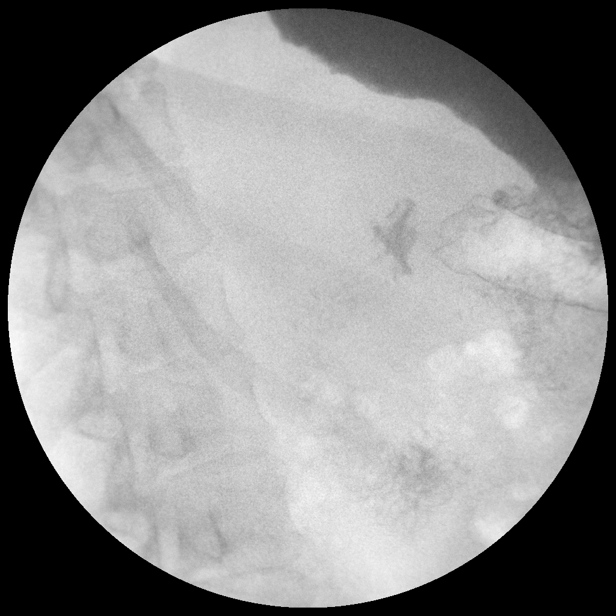

[15 of 24 positions shown; findings below may reference images not displayed]

FINDINGS: Fluoroscopic evaluation of swallowing demonstrates normal cervical
esophagus. Normal primary esophageal peristaltic waves. No
stricture, fold thickening or mass. No hiatal hernia.

Stomach, duodenal bulb and duodenal sweep are unremarkable. No fold
thickening, mass or ulceration. No reflux with the water siphon
maneuver.

Scout film of the abdomen demonstrates nonobstructive bowel gas
pattern. No organomegaly or suspicious calcification.
IMPRESSION: Unremarkable upper GI.

## 2016-03-30 ENCOUNTER — Encounter (HOSPITAL_COMMUNITY): Payer: Self-pay

## 2016-04-24 DIAGNOSIS — H6691 Otitis media, unspecified, right ear: Secondary | ICD-10-CM | POA: Diagnosis not present

## 2016-04-27 DIAGNOSIS — G4730 Sleep apnea, unspecified: Secondary | ICD-10-CM | POA: Diagnosis not present

## 2016-04-27 DIAGNOSIS — Z020 Encounter for examination for admission to educational institution: Secondary | ICD-10-CM | POA: Diagnosis not present

## 2016-04-27 DIAGNOSIS — E039 Hypothyroidism, unspecified: Secondary | ICD-10-CM | POA: Diagnosis not present

## 2016-04-27 DIAGNOSIS — Z23 Encounter for immunization: Secondary | ICD-10-CM | POA: Diagnosis not present

## 2016-04-29 DIAGNOSIS — Z23 Encounter for immunization: Secondary | ICD-10-CM | POA: Diagnosis not present

## 2016-07-22 DIAGNOSIS — S300XXA Contusion of lower back and pelvis, initial encounter: Secondary | ICD-10-CM | POA: Diagnosis not present

## 2016-07-22 DIAGNOSIS — M545 Low back pain: Secondary | ICD-10-CM | POA: Diagnosis not present

## 2016-07-27 DIAGNOSIS — Z4651 Encounter for fitting and adjustment of gastric lap band: Secondary | ICD-10-CM | POA: Diagnosis not present

## 2016-08-26 DIAGNOSIS — J0100 Acute maxillary sinusitis, unspecified: Secondary | ICD-10-CM | POA: Diagnosis not present

## 2016-08-26 DIAGNOSIS — J309 Allergic rhinitis, unspecified: Secondary | ICD-10-CM | POA: Diagnosis not present

## 2016-09-16 DIAGNOSIS — G4733 Obstructive sleep apnea (adult) (pediatric): Secondary | ICD-10-CM | POA: Diagnosis not present

## 2016-11-17 DIAGNOSIS — B029 Zoster without complications: Secondary | ICD-10-CM | POA: Diagnosis not present

## 2016-11-25 DIAGNOSIS — Z23 Encounter for immunization: Secondary | ICD-10-CM | POA: Diagnosis not present

## 2016-12-03 DIAGNOSIS — Z4651 Encounter for fitting and adjustment of gastric lap band: Secondary | ICD-10-CM | POA: Diagnosis not present

## 2017-12-15 DIAGNOSIS — S93401A Sprain of unspecified ligament of right ankle, initial encounter: Secondary | ICD-10-CM | POA: Diagnosis not present

## 2018-03-13 DIAGNOSIS — M792 Neuralgia and neuritis, unspecified: Secondary | ICD-10-CM | POA: Diagnosis not present

## 2018-03-21 DIAGNOSIS — R51 Headache: Secondary | ICD-10-CM | POA: Diagnosis not present

## 2018-03-21 DIAGNOSIS — R631 Polydipsia: Secondary | ICD-10-CM | POA: Diagnosis not present

## 2018-03-21 DIAGNOSIS — G4733 Obstructive sleep apnea (adult) (pediatric): Secondary | ICD-10-CM | POA: Diagnosis not present

## 2018-03-21 DIAGNOSIS — E039 Hypothyroidism, unspecified: Secondary | ICD-10-CM | POA: Diagnosis not present

## 2018-04-04 DIAGNOSIS — J0100 Acute maxillary sinusitis, unspecified: Secondary | ICD-10-CM | POA: Diagnosis not present

## 2021-07-18 DIAGNOSIS — H6990 Unspecified Eustachian tube disorder, unspecified ear: Secondary | ICD-10-CM | POA: Diagnosis not present

## 2021-07-27 DIAGNOSIS — G4733 Obstructive sleep apnea (adult) (pediatric): Secondary | ICD-10-CM | POA: Diagnosis not present

## 2021-07-28 DIAGNOSIS — E039 Hypothyroidism, unspecified: Secondary | ICD-10-CM | POA: Diagnosis not present

## 2021-07-28 DIAGNOSIS — F988 Other specified behavioral and emotional disorders with onset usually occurring in childhood and adolescence: Secondary | ICD-10-CM | POA: Diagnosis not present

## 2021-07-28 DIAGNOSIS — Z6839 Body mass index (BMI) 39.0-39.9, adult: Secondary | ICD-10-CM | POA: Diagnosis not present

## 2021-11-24 DIAGNOSIS — J019 Acute sinusitis, unspecified: Secondary | ICD-10-CM | POA: Diagnosis not present

## 2021-11-24 DIAGNOSIS — R0981 Nasal congestion: Secondary | ICD-10-CM | POA: Diagnosis not present

## 2022-08-06 DIAGNOSIS — H6691 Otitis media, unspecified, right ear: Secondary | ICD-10-CM | POA: Diagnosis not present

## 2022-08-06 DIAGNOSIS — H6091 Unspecified otitis externa, right ear: Secondary | ICD-10-CM | POA: Diagnosis not present

## 2022-08-06 DIAGNOSIS — H9203 Otalgia, bilateral: Secondary | ICD-10-CM | POA: Diagnosis not present

## 2022-09-01 DIAGNOSIS — G4733 Obstructive sleep apnea (adult) (pediatric): Secondary | ICD-10-CM | POA: Diagnosis not present

## 2023-02-23 DIAGNOSIS — R070 Pain in throat: Secondary | ICD-10-CM | POA: Diagnosis not present

## 2023-02-23 DIAGNOSIS — R509 Fever, unspecified: Secondary | ICD-10-CM | POA: Diagnosis not present

## 2023-02-23 DIAGNOSIS — R051 Acute cough: Secondary | ICD-10-CM | POA: Diagnosis not present

## 2023-02-23 DIAGNOSIS — R0981 Nasal congestion: Secondary | ICD-10-CM | POA: Diagnosis not present

## 2023-05-23 DIAGNOSIS — R0981 Nasal congestion: Secondary | ICD-10-CM | POA: Diagnosis not present

## 2023-05-23 DIAGNOSIS — R051 Acute cough: Secondary | ICD-10-CM | POA: Diagnosis not present

## 2023-08-13 DIAGNOSIS — R509 Fever, unspecified: Secondary | ICD-10-CM | POA: Diagnosis not present

## 2023-08-13 DIAGNOSIS — Z20822 Contact with and (suspected) exposure to covid-19: Secondary | ICD-10-CM | POA: Diagnosis not present

## 2023-08-13 DIAGNOSIS — R051 Acute cough: Secondary | ICD-10-CM | POA: Diagnosis not present

## 2023-08-13 DIAGNOSIS — J0110 Acute frontal sinusitis, unspecified: Secondary | ICD-10-CM | POA: Diagnosis not present

## 2023-10-25 DIAGNOSIS — R519 Headache, unspecified: Secondary | ICD-10-CM | POA: Diagnosis not present

## 2023-10-25 DIAGNOSIS — H53143 Visual discomfort, bilateral: Secondary | ICD-10-CM | POA: Diagnosis not present

## 2024-04-02 DIAGNOSIS — R0981 Nasal congestion: Secondary | ICD-10-CM | POA: Diagnosis not present

## 2024-04-02 DIAGNOSIS — R070 Pain in throat: Secondary | ICD-10-CM | POA: Diagnosis not present

## 2024-04-02 DIAGNOSIS — R509 Fever, unspecified: Secondary | ICD-10-CM | POA: Diagnosis not present

## 2024-04-02 DIAGNOSIS — R051 Acute cough: Secondary | ICD-10-CM | POA: Diagnosis not present

## 2024-04-06 DIAGNOSIS — R0981 Nasal congestion: Secondary | ICD-10-CM | POA: Diagnosis not present

## 2024-04-06 DIAGNOSIS — R059 Cough, unspecified: Secondary | ICD-10-CM | POA: Diagnosis not present

## 2024-04-06 DIAGNOSIS — J329 Chronic sinusitis, unspecified: Secondary | ICD-10-CM | POA: Diagnosis not present

## 2024-06-05 DIAGNOSIS — R0981 Nasal congestion: Secondary | ICD-10-CM | POA: Diagnosis not present

## 2024-06-05 DIAGNOSIS — R509 Fever, unspecified: Secondary | ICD-10-CM | POA: Diagnosis not present

## 2024-06-05 DIAGNOSIS — R051 Acute cough: Secondary | ICD-10-CM | POA: Diagnosis not present

## 2024-06-05 DIAGNOSIS — M791 Myalgia, unspecified site: Secondary | ICD-10-CM | POA: Diagnosis not present
